# Patient Record
Sex: Female | Born: 1935 | Race: White | Hispanic: No | Smoking: Never smoker
Health system: Southern US, Community
[De-identification: ages and names within clinical notes are randomized; demographics above are authoritative.]

## PROBLEM LIST (undated history)

## (undated) DIAGNOSIS — N393 Stress incontinence (female) (male): Secondary | ICD-10-CM

## (undated) DIAGNOSIS — Z950 Presence of cardiac pacemaker: Secondary | ICD-10-CM

## (undated) DIAGNOSIS — N289 Disorder of kidney and ureter, unspecified: Secondary | ICD-10-CM

## (undated) DIAGNOSIS — R0989 Other specified symptoms and signs involving the circulatory and respiratory systems: Secondary | ICD-10-CM

## (undated) DIAGNOSIS — F0150 Vascular dementia without behavioral disturbance: Secondary | ICD-10-CM

## (undated) DIAGNOSIS — K219 Gastro-esophageal reflux disease without esophagitis: Secondary | ICD-10-CM

## (undated) DIAGNOSIS — L409 Psoriasis, unspecified: Secondary | ICD-10-CM

## (undated) DIAGNOSIS — I10 Essential (primary) hypertension: Secondary | ICD-10-CM

## (undated) DIAGNOSIS — I495 Sick sinus syndrome: Secondary | ICD-10-CM

## (undated) DIAGNOSIS — T7840XA Allergy, unspecified, initial encounter: Secondary | ICD-10-CM

## (undated) DIAGNOSIS — R42 Dizziness and giddiness: Secondary | ICD-10-CM

## (undated) DIAGNOSIS — I480 Paroxysmal atrial fibrillation: Secondary | ICD-10-CM

## (undated) DIAGNOSIS — Z9229 Personal history of other drug therapy: Secondary | ICD-10-CM

## (undated) DIAGNOSIS — I872 Venous insufficiency (chronic) (peripheral): Secondary | ICD-10-CM

## (undated) HISTORY — DX: Gastro-esophageal reflux disease without esophagitis: K21.9

## (undated) HISTORY — DX: Disorder of kidney and ureter, unspecified: N28.9

## (undated) HISTORY — DX: Presence of cardiac pacemaker: Z95.0

## (undated) HISTORY — DX: Allergy, unspecified, initial encounter: T78.40XA

## (undated) HISTORY — DX: Psoriasis, unspecified: L40.9

## (undated) HISTORY — DX: Dizziness and giddiness: R42

## (undated) HISTORY — PX: ESOPHAGEAL DILATION: SHX303

## (undated) HISTORY — PX: ABDOMINAL HYSTERECTOMY: SHX81

## (undated) HISTORY — DX: Stress incontinence (female) (male): N39.3

## (undated) HISTORY — PX: CHOLECYSTECTOMY: SHX55

## (undated) HISTORY — PX: OTHER SURGICAL HISTORY: SHX169

## (undated) HISTORY — DX: Essential (primary) hypertension: I10

## (undated) HISTORY — DX: Other specified symptoms and signs involving the circulatory and respiratory systems: R09.89

## (undated) HISTORY — DX: Sick sinus syndrome: I49.5

## (undated) HISTORY — DX: Personal history of other drug therapy: Z92.29

## (undated) HISTORY — DX: Venous insufficiency (chronic) (peripheral): I87.2

## (undated) HISTORY — DX: Paroxysmal atrial fibrillation: I48.0

## (undated) HISTORY — DX: Vascular dementia, unspecified severity, without behavioral disturbance, psychotic disturbance, mood disturbance, and anxiety: F01.50

---

## 1999-03-10 ENCOUNTER — Encounter: Admission: RE | Admit: 1999-03-10 | Discharge: 1999-03-10 | Payer: Self-pay | Admitting: General Surgery

## 1999-03-10 ENCOUNTER — Encounter: Payer: Self-pay | Admitting: General Surgery

## 2000-03-10 ENCOUNTER — Encounter: Admission: RE | Admit: 2000-03-10 | Discharge: 2000-03-10 | Payer: Self-pay | Admitting: General Surgery

## 2000-03-10 ENCOUNTER — Encounter: Payer: Self-pay | Admitting: General Surgery

## 2001-03-16 ENCOUNTER — Encounter: Admission: RE | Admit: 2001-03-16 | Discharge: 2001-03-16 | Payer: Self-pay | Admitting: General Surgery

## 2001-03-16 ENCOUNTER — Encounter: Payer: Self-pay | Admitting: General Surgery

## 2001-04-11 ENCOUNTER — Ambulatory Visit (HOSPITAL_COMMUNITY): Admission: RE | Admit: 2001-04-11 | Discharge: 2001-04-11 | Payer: Self-pay | Admitting: Cardiology

## 2001-07-01 ENCOUNTER — Ambulatory Visit (HOSPITAL_COMMUNITY): Admission: RE | Admit: 2001-07-01 | Discharge: 2001-07-01 | Payer: Self-pay | Admitting: Cardiology

## 2001-11-16 ENCOUNTER — Ambulatory Visit (HOSPITAL_COMMUNITY): Admission: RE | Admit: 2001-11-16 | Discharge: 2001-11-16 | Payer: Self-pay | Admitting: Cardiology

## 2002-02-02 DIAGNOSIS — I495 Sick sinus syndrome: Secondary | ICD-10-CM

## 2002-02-02 HISTORY — DX: Sick sinus syndrome: I49.5

## 2002-02-07 ENCOUNTER — Inpatient Hospital Stay (HOSPITAL_COMMUNITY): Admission: EM | Admit: 2002-02-07 | Discharge: 2002-02-15 | Payer: Self-pay | Admitting: Internal Medicine

## 2002-02-07 ENCOUNTER — Encounter: Payer: Self-pay | Admitting: Internal Medicine

## 2002-02-08 ENCOUNTER — Encounter (INDEPENDENT_AMBULATORY_CARE_PROVIDER_SITE_OTHER): Payer: Self-pay | Admitting: Cardiology

## 2002-02-14 ENCOUNTER — Encounter: Payer: Self-pay | Admitting: Internal Medicine

## 2002-02-15 ENCOUNTER — Encounter: Payer: Self-pay | Admitting: Cardiology

## 2002-03-17 ENCOUNTER — Encounter: Payer: Self-pay | Admitting: General Surgery

## 2002-03-17 ENCOUNTER — Encounter: Admission: RE | Admit: 2002-03-17 | Discharge: 2002-03-17 | Payer: Self-pay | Admitting: General Surgery

## 2002-11-03 ENCOUNTER — Encounter: Payer: Self-pay | Admitting: Internal Medicine

## 2002-11-03 ENCOUNTER — Other Ambulatory Visit: Admission: RE | Admit: 2002-11-03 | Discharge: 2002-11-03 | Payer: Self-pay | Admitting: Internal Medicine

## 2002-11-03 LAB — CONVERTED CEMR LAB

## 2003-03-28 ENCOUNTER — Encounter: Admission: RE | Admit: 2003-03-28 | Discharge: 2003-03-28 | Payer: Self-pay | Admitting: General Surgery

## 2003-06-12 ENCOUNTER — Encounter: Payer: Self-pay | Admitting: Internal Medicine

## 2004-04-03 ENCOUNTER — Ambulatory Visit: Payer: Self-pay | Admitting: Internal Medicine

## 2004-04-04 ENCOUNTER — Encounter: Admission: RE | Admit: 2004-04-04 | Discharge: 2004-04-04 | Payer: Self-pay | Admitting: General Surgery

## 2004-07-11 ENCOUNTER — Ambulatory Visit: Payer: Self-pay | Admitting: Internal Medicine

## 2004-10-02 ENCOUNTER — Ambulatory Visit: Payer: Self-pay | Admitting: Internal Medicine

## 2004-11-14 ENCOUNTER — Ambulatory Visit: Payer: Self-pay | Admitting: Internal Medicine

## 2005-01-06 ENCOUNTER — Ambulatory Visit: Payer: Self-pay | Admitting: Internal Medicine

## 2005-04-15 ENCOUNTER — Encounter: Admission: RE | Admit: 2005-04-15 | Discharge: 2005-04-15 | Payer: Self-pay | Admitting: Internal Medicine

## 2005-05-07 ENCOUNTER — Ambulatory Visit: Payer: Self-pay | Admitting: Internal Medicine

## 2005-09-08 ENCOUNTER — Ambulatory Visit: Payer: Self-pay | Admitting: Internal Medicine

## 2006-01-07 ENCOUNTER — Ambulatory Visit: Payer: Self-pay | Admitting: Internal Medicine

## 2006-02-11 ENCOUNTER — Ambulatory Visit: Payer: Self-pay | Admitting: Internal Medicine

## 2006-04-20 ENCOUNTER — Encounter: Admission: RE | Admit: 2006-04-20 | Discharge: 2006-04-20 | Payer: Self-pay | Admitting: General Surgery

## 2006-06-10 ENCOUNTER — Encounter: Payer: Self-pay | Admitting: Internal Medicine

## 2006-06-10 DIAGNOSIS — I498 Other specified cardiac arrhythmias: Secondary | ICD-10-CM | POA: Insufficient documentation

## 2006-06-10 DIAGNOSIS — M949 Disorder of cartilage, unspecified: Secondary | ICD-10-CM

## 2006-06-10 DIAGNOSIS — M899 Disorder of bone, unspecified: Secondary | ICD-10-CM | POA: Insufficient documentation

## 2006-06-10 DIAGNOSIS — L408 Other psoriasis: Secondary | ICD-10-CM

## 2006-06-10 DIAGNOSIS — N183 Chronic kidney disease, stage 3 (moderate): Secondary | ICD-10-CM

## 2006-06-10 DIAGNOSIS — I10 Essential (primary) hypertension: Secondary | ICD-10-CM | POA: Insufficient documentation

## 2006-06-10 DIAGNOSIS — J309 Allergic rhinitis, unspecified: Secondary | ICD-10-CM | POA: Insufficient documentation

## 2006-06-10 DIAGNOSIS — I480 Paroxysmal atrial fibrillation: Secondary | ICD-10-CM | POA: Insufficient documentation

## 2006-06-11 DIAGNOSIS — M19049 Primary osteoarthritis, unspecified hand: Secondary | ICD-10-CM | POA: Insufficient documentation

## 2006-06-18 ENCOUNTER — Ambulatory Visit: Payer: Self-pay | Admitting: Internal Medicine

## 2006-06-21 LAB — CONVERTED CEMR LAB
ALT: 18 units/L (ref 0–40)
AST: 29 units/L (ref 0–37)
Alkaline Phosphatase: 35 units/L — ABNORMAL LOW (ref 39–117)
BUN: 24 mg/dL — ABNORMAL HIGH (ref 6–23)
Chloride: 106 meq/L (ref 96–112)
Creatinine, Ser: 1.3 mg/dL — ABNORMAL HIGH (ref 0.4–1.2)
Eosinophils Relative: 1.7 % (ref 0.0–5.0)
GFR calc Af Amer: 51 mL/min
Glucose, Bld: 101 mg/dL — ABNORMAL HIGH (ref 70–99)
HCT: 36.3 % (ref 36.0–46.0)
Hemoglobin: 12.4 g/dL (ref 12.0–15.0)
Lymphocytes Relative: 43.2 % (ref 12.0–46.0)
MCHC: 34.1 g/dL (ref 30.0–36.0)
Phosphorus: 3.7 mg/dL (ref 2.3–4.6)
Potassium: 4.2 meq/L (ref 3.5–5.1)
RDW: 12.5 % (ref 11.5–14.6)
Sed Rate: 23 mm/hr (ref 0–25)
Sodium: 143 meq/L (ref 135–145)
Total Protein: 6 g/dL (ref 6.0–8.3)
WBC: 7 10*3/uL (ref 4.5–10.5)

## 2006-07-20 ENCOUNTER — Telehealth: Payer: Self-pay | Admitting: Family Medicine

## 2006-07-29 ENCOUNTER — Ambulatory Visit: Payer: Self-pay | Admitting: Internal Medicine

## 2006-11-17 ENCOUNTER — Ambulatory Visit: Payer: Self-pay | Admitting: Internal Medicine

## 2006-11-19 LAB — CONVERTED CEMR LAB
Free T4: 1.1 ng/dL (ref 0.6–1.6)
GFR calc Af Amer: 46 mL/min
Glucose, Bld: 90 mg/dL (ref 70–99)
Sodium: 140 meq/L (ref 135–145)

## 2007-01-13 ENCOUNTER — Ambulatory Visit: Payer: Self-pay | Admitting: Internal Medicine

## 2007-01-17 ENCOUNTER — Ambulatory Visit: Payer: Self-pay | Admitting: Internal Medicine

## 2007-04-01 ENCOUNTER — Other Ambulatory Visit: Payer: Self-pay

## 2007-04-01 ENCOUNTER — Encounter: Payer: Self-pay | Admitting: Internal Medicine

## 2007-04-02 ENCOUNTER — Encounter: Payer: Self-pay | Admitting: Internal Medicine

## 2007-04-04 ENCOUNTER — Encounter: Payer: Self-pay | Admitting: Internal Medicine

## 2007-04-05 ENCOUNTER — Telehealth (INDEPENDENT_AMBULATORY_CARE_PROVIDER_SITE_OTHER): Payer: Self-pay | Admitting: *Deleted

## 2007-04-06 ENCOUNTER — Ambulatory Visit: Payer: Self-pay | Admitting: Internal Medicine

## 2007-04-06 DIAGNOSIS — R42 Dizziness and giddiness: Secondary | ICD-10-CM

## 2007-04-06 DIAGNOSIS — R7301 Impaired fasting glucose: Secondary | ICD-10-CM

## 2007-04-12 ENCOUNTER — Encounter: Payer: Self-pay | Admitting: Internal Medicine

## 2007-04-13 ENCOUNTER — Telehealth: Payer: Self-pay | Admitting: Internal Medicine

## 2007-04-18 ENCOUNTER — Encounter: Payer: Self-pay | Admitting: Internal Medicine

## 2007-04-20 ENCOUNTER — Encounter: Payer: Self-pay | Admitting: Internal Medicine

## 2007-04-28 ENCOUNTER — Telehealth (INDEPENDENT_AMBULATORY_CARE_PROVIDER_SITE_OTHER): Payer: Self-pay | Admitting: *Deleted

## 2007-04-29 ENCOUNTER — Ambulatory Visit: Payer: Self-pay | Admitting: Internal Medicine

## 2007-05-02 ENCOUNTER — Encounter: Admission: RE | Admit: 2007-05-02 | Discharge: 2007-05-02 | Payer: Self-pay | Admitting: Internal Medicine

## 2007-05-04 ENCOUNTER — Encounter (INDEPENDENT_AMBULATORY_CARE_PROVIDER_SITE_OTHER): Payer: Self-pay | Admitting: *Deleted

## 2007-05-10 ENCOUNTER — Encounter: Payer: Self-pay | Admitting: Internal Medicine

## 2007-05-11 ENCOUNTER — Encounter: Payer: Self-pay | Admitting: Internal Medicine

## 2007-05-19 ENCOUNTER — Telehealth: Payer: Self-pay | Admitting: Internal Medicine

## 2007-05-26 ENCOUNTER — Telehealth (INDEPENDENT_AMBULATORY_CARE_PROVIDER_SITE_OTHER): Payer: Self-pay | Admitting: *Deleted

## 2007-08-04 ENCOUNTER — Telehealth: Payer: Self-pay | Admitting: Family Medicine

## 2007-09-01 ENCOUNTER — Ambulatory Visit: Payer: Self-pay | Admitting: Internal Medicine

## 2007-09-05 LAB — CONVERTED CEMR LAB
AST: 26 units/L (ref 0–37)
BUN: 28 mg/dL — ABNORMAL HIGH (ref 6–23)
Basophils Absolute: 0.1 10*3/uL (ref 0.0–0.1)
CO2: 27 meq/L (ref 19–32)
Calcium: 8.8 mg/dL (ref 8.4–10.5)
Chloride: 108 meq/L (ref 96–112)
Creatinine, Ser: 1.4 mg/dL — ABNORMAL HIGH (ref 0.4–1.2)
Eosinophils Absolute: 0.1 10*3/uL (ref 0.0–0.7)
Eosinophils Relative: 0.9 % (ref 0.0–5.0)
Hemoglobin: 11.2 g/dL — ABNORMAL LOW (ref 12.0–15.0)
Lipase: 41 units/L (ref 11.0–59.0)
Lymphocytes Relative: 20.8 % (ref 12.0–46.0)
MCHC: 33.7 g/dL (ref 30.0–36.0)
MCV: 92.5 fL (ref 78.0–100.0)
Phosphorus: 3.7 mg/dL (ref 2.3–4.6)
Platelets: 241 10*3/uL (ref 150–400)
RBC: 3.6 M/uL — ABNORMAL LOW (ref 3.87–5.11)
Total Bilirubin: 0.4 mg/dL (ref 0.3–1.2)
Total Protein: 6.4 g/dL (ref 6.0–8.3)
WBC: 10.6 10*3/uL — ABNORMAL HIGH (ref 4.5–10.5)

## 2007-10-26 ENCOUNTER — Ambulatory Visit: Payer: Self-pay | Admitting: Internal Medicine

## 2007-11-24 ENCOUNTER — Telehealth: Payer: Self-pay | Admitting: Internal Medicine

## 2007-12-05 ENCOUNTER — Ambulatory Visit: Payer: Self-pay | Admitting: Family Medicine

## 2007-12-08 ENCOUNTER — Encounter: Payer: Self-pay | Admitting: Family Medicine

## 2008-01-02 ENCOUNTER — Ambulatory Visit: Payer: Self-pay | Admitting: Internal Medicine

## 2008-02-09 ENCOUNTER — Ambulatory Visit: Payer: Self-pay | Admitting: Family Medicine

## 2008-02-13 ENCOUNTER — Encounter: Payer: Self-pay | Admitting: Internal Medicine

## 2008-02-15 ENCOUNTER — Telehealth: Payer: Self-pay | Admitting: Internal Medicine

## 2008-02-24 ENCOUNTER — Ambulatory Visit: Payer: Self-pay | Admitting: Internal Medicine

## 2008-03-01 ENCOUNTER — Encounter: Payer: Self-pay | Admitting: Internal Medicine

## 2008-04-11 ENCOUNTER — Encounter: Payer: Self-pay | Admitting: Internal Medicine

## 2008-04-19 ENCOUNTER — Telehealth: Payer: Self-pay | Admitting: Internal Medicine

## 2008-05-02 ENCOUNTER — Encounter: Admission: RE | Admit: 2008-05-02 | Discharge: 2008-05-02 | Payer: Self-pay | Admitting: Internal Medicine

## 2008-05-03 ENCOUNTER — Encounter: Payer: Self-pay | Admitting: Internal Medicine

## 2008-05-07 ENCOUNTER — Ambulatory Visit: Payer: Self-pay | Admitting: Internal Medicine

## 2008-05-09 LAB — CONVERTED CEMR LAB
ALT: 13 units/L (ref 0–35)
AST: 24 units/L (ref 0–37)
Bilirubin, Direct: 0.1 mg/dL (ref 0.0–0.3)
CO2: 27 meq/L (ref 19–32)
Calcium: 9 mg/dL (ref 8.4–10.5)
Eosinophils Absolute: 0.2 10*3/uL (ref 0.0–0.7)
GFR calc non Af Amer: 26.86 mL/min (ref >60)
HCT: 33.1 % — ABNORMAL LOW (ref 36.0–46.0)
Hemoglobin: 11.2 g/dL — ABNORMAL LOW (ref 12.0–15.0)
Lymphocytes Relative: 27.6 % (ref 12.0–46.0)
Lymphs Abs: 2.4 10*3/uL (ref 0.7–4.0)
Monocytes Relative: 5.2 % (ref 3.0–12.0)
Neutrophils Relative %: 64.8 % (ref 43.0–77.0)
Potassium: 4.3 meq/L (ref 3.5–5.1)
RDW: 13.5 % (ref 11.5–14.6)
TSH: 2.52 microintl units/mL (ref 0.35–5.50)
Total Bilirubin: 0.6 mg/dL (ref 0.3–1.2)
Total Protein: 6 g/dL (ref 6.0–8.3)

## 2008-05-11 ENCOUNTER — Encounter: Payer: Self-pay | Admitting: Internal Medicine

## 2008-09-06 ENCOUNTER — Ambulatory Visit: Payer: Self-pay | Admitting: Internal Medicine

## 2008-09-07 LAB — CONVERTED CEMR LAB
Albumin: 3.7 g/dL (ref 3.5–5.2)
BUN: 35 mg/dL — ABNORMAL HIGH (ref 6–23)
Basophils Absolute: 0 10*3/uL (ref 0.0–0.1)
Basophils Relative: 0.3 % (ref 0.0–3.0)
Chloride: 112 meq/L (ref 96–112)
Eosinophils Relative: 2.5 % (ref 0.0–5.0)
Glucose, Bld: 97 mg/dL (ref 70–99)
Lymphocytes Relative: 38.1 % (ref 12.0–46.0)
Lymphs Abs: 3.1 10*3/uL (ref 0.7–4.0)
MCV: 93.4 fL (ref 78.0–100.0)
Monocytes Relative: 6.4 % (ref 3.0–12.0)
Neutro Abs: 4.4 10*3/uL (ref 1.4–7.7)
Neutrophils Relative %: 52.7 % (ref 43.0–77.0)
Sodium: 145 meq/L (ref 135–145)

## 2008-10-15 ENCOUNTER — Telehealth: Payer: Self-pay | Admitting: Internal Medicine

## 2008-12-25 ENCOUNTER — Ambulatory Visit: Payer: Self-pay | Admitting: Internal Medicine

## 2009-01-10 ENCOUNTER — Ambulatory Visit: Payer: Self-pay | Admitting: Internal Medicine

## 2009-02-05 ENCOUNTER — Ambulatory Visit: Payer: Self-pay | Admitting: Family Medicine

## 2009-05-10 ENCOUNTER — Encounter: Admission: RE | Admit: 2009-05-10 | Discharge: 2009-05-10 | Payer: Self-pay | Admitting: Internal Medicine

## 2009-05-14 ENCOUNTER — Encounter: Payer: Self-pay | Admitting: Internal Medicine

## 2009-07-08 ENCOUNTER — Ambulatory Visit: Payer: Self-pay | Admitting: Internal Medicine

## 2009-07-16 ENCOUNTER — Telehealth: Payer: Self-pay | Admitting: Internal Medicine

## 2009-07-18 ENCOUNTER — Encounter: Payer: Self-pay | Admitting: Internal Medicine

## 2009-07-19 ENCOUNTER — Telehealth (INDEPENDENT_AMBULATORY_CARE_PROVIDER_SITE_OTHER): Payer: Self-pay | Admitting: *Deleted

## 2009-07-30 ENCOUNTER — Telehealth: Payer: Self-pay | Admitting: Internal Medicine

## 2009-07-31 ENCOUNTER — Encounter: Payer: Self-pay | Admitting: Internal Medicine

## 2009-08-02 ENCOUNTER — Encounter: Payer: Self-pay | Admitting: Internal Medicine

## 2009-08-20 ENCOUNTER — Encounter: Payer: Self-pay | Admitting: Internal Medicine

## 2009-08-23 ENCOUNTER — Ambulatory Visit: Payer: Self-pay | Admitting: Internal Medicine

## 2009-08-23 DIAGNOSIS — M5137 Other intervertebral disc degeneration, lumbosacral region: Secondary | ICD-10-CM

## 2009-08-26 ENCOUNTER — Encounter: Payer: Self-pay | Admitting: Internal Medicine

## 2009-08-30 ENCOUNTER — Telehealth: Payer: Self-pay | Admitting: Internal Medicine

## 2009-09-06 ENCOUNTER — Telehealth: Payer: Self-pay | Admitting: Internal Medicine

## 2009-09-09 ENCOUNTER — Ambulatory Visit: Payer: Self-pay | Admitting: Internal Medicine

## 2009-09-12 ENCOUNTER — Encounter: Payer: Self-pay | Admitting: Internal Medicine

## 2009-09-16 ENCOUNTER — Telehealth: Payer: Self-pay | Admitting: Internal Medicine

## 2009-09-20 ENCOUNTER — Telehealth: Payer: Self-pay | Admitting: Internal Medicine

## 2009-09-24 ENCOUNTER — Encounter: Payer: Self-pay | Admitting: Internal Medicine

## 2009-09-24 ENCOUNTER — Telehealth: Payer: Self-pay | Admitting: Internal Medicine

## 2009-09-26 ENCOUNTER — Telehealth: Payer: Self-pay | Admitting: Internal Medicine

## 2009-09-26 ENCOUNTER — Encounter: Payer: Self-pay | Admitting: Internal Medicine

## 2009-10-01 ENCOUNTER — Ambulatory Visit: Payer: Self-pay | Admitting: Internal Medicine

## 2009-10-03 ENCOUNTER — Encounter: Payer: Self-pay | Admitting: Internal Medicine

## 2009-10-11 ENCOUNTER — Encounter: Payer: Self-pay | Admitting: Internal Medicine

## 2009-10-16 ENCOUNTER — Telehealth: Payer: Self-pay | Admitting: Internal Medicine

## 2009-10-21 ENCOUNTER — Telehealth: Payer: Self-pay | Admitting: Internal Medicine

## 2009-10-22 ENCOUNTER — Encounter: Payer: Self-pay | Admitting: Internal Medicine

## 2009-11-19 ENCOUNTER — Encounter: Payer: Self-pay | Admitting: Internal Medicine

## 2009-12-19 ENCOUNTER — Encounter: Payer: Self-pay | Admitting: Internal Medicine

## 2009-12-19 ENCOUNTER — Telehealth (INDEPENDENT_AMBULATORY_CARE_PROVIDER_SITE_OTHER): Payer: Self-pay | Admitting: *Deleted

## 2009-12-30 ENCOUNTER — Encounter: Payer: Self-pay | Admitting: Internal Medicine

## 2010-01-31 ENCOUNTER — Ambulatory Visit: Payer: Self-pay | Admitting: Internal Medicine

## 2010-02-04 LAB — CONVERTED CEMR LAB
Basophils Absolute: 0 10*3/uL (ref 0.0–0.1)
Chloride: 109 meq/L (ref 96–112)
Creatinine, Ser: 1.9 mg/dL — ABNORMAL HIGH (ref 0.4–1.2)
Free T4: 1.03 ng/dL (ref 0.60–1.60)
Lymphs Abs: 2.1 10*3/uL (ref 0.7–4.0)
MCV: 95.9 fL (ref 78.0–100.0)
Monocytes Absolute: 0.6 10*3/uL (ref 0.1–1.0)
Neutro Abs: 4 10*3/uL (ref 1.4–7.7)
Phosphorus: 3 mg/dL (ref 2.3–4.6)
Platelets: 225 10*3/uL (ref 150.0–400.0)
RDW: 15.5 % — ABNORMAL HIGH (ref 11.5–14.6)
WBC: 6.9 10*3/uL (ref 4.5–10.5)

## 2010-02-19 ENCOUNTER — Encounter: Payer: Self-pay | Admitting: Cardiovascular Disease

## 2010-02-19 ENCOUNTER — Ambulatory Visit
Admission: RE | Admit: 2010-02-19 | Discharge: 2010-02-19 | Payer: Self-pay | Source: Home / Self Care | Attending: Cardiovascular Disease | Admitting: Cardiovascular Disease

## 2010-03-02 LAB — CONVERTED CEMR LAB
CO2: 31 meq/L (ref 19–32)
Chloride: 107 meq/L (ref 96–112)
Creatinine, Ser: 1.2 mg/dL (ref 0.4–1.2)
GFR calc Af Amer: 51 mL/min
GFR calc non Af Amer: 42 mL/min
Sodium: 143 meq/L (ref 135–145)

## 2010-03-04 NOTE — Miscellaneous (Signed)
Summary: Missed Visit/[facility]  Missed Visit/[facility]   Imported By: Lanelle Bal 10/03/2009 11:53:56  _____________________________________________________________________  External Attachment:    Type:   Image     Comment:   External Document

## 2010-03-04 NOTE — Progress Notes (Signed)
Summary: Surgical Clearance  Phone Note Call from Patient   Caller: [facility] Call For: Cindee Salt MD Summary of Call: [facility] faxed form asking for clearance to do a lumbar epidural steroid injection, does pt need appt to do this? ? pt canceled 10/17/2009 appt. Please advise, Form on your desk  Initial call taken by: Mervin Hack CMA Duncan Dull),  December 19, 2009 4:58 PM  Follow-up for Phone Call        I signed form as she should be okay for this can keep December appt  I stated they will need to let me know if visit from August is not recent enough Follow-up by: Cindee Salt MD,  December 19, 2009 5:26 PM  Additional Follow-up for Phone Call Additional follow up Details #1::        Form faxed to [facility]. Additional Follow-up by: Beau Fanny,  December 20, 2009 8:53 AM

## 2010-03-04 NOTE — Consult Note (Signed)
Summary: [facility]   Imported By: Lester Grainger 07/29/2009 07:25:02  _____________________________________________________________________  External Attachment:    Type:   Image     Comment:   External Document  Appended Document: [facility] form signed--then faxed back

## 2010-03-04 NOTE — Letter (Signed)
Summary: [facility]  [facility]   Imported By: Maryln Gottron 12/27/2009 12:31:44  _____________________________________________________________________  External Attachment:    Type:   Image     Comment:   External Document  Appended Document: [facility] Evaluation considering lumbar epidural steroid injection

## 2010-03-04 NOTE — Letter (Signed)
Summary: Results Follow up Letter  [place] [facility]  [address] [place], [place] [address]   Phone: [phone]  Fax: [phone]    05/14/2009 MRN: [id number]  [facility] [address] [place], [place]  [address]  Dear Ms. Carrier,  The following are the results of your recent test(s):  Test         Result    Pap Smear:        Normal _____  Not Normal _____ Comments: ______________________________________________________ Cholesterol: LDL(Bad cholesterol):         Your goal is less than:         HDL (Good cholesterol):       Your goal is more than: Comments:  ______________________________________________________ Mammogram:        Normal __X___  Not Normal _____ Comments: Mammogram looks fine At your age, I don't recommend continued screening with mammograms. ___________________________________________________________________ Hemoccult:        Normal _____  Not normal _______ Comments:    _____________________________________________________________________ Other Tests:    We routinely do not discuss normal results over the telephone.  If you desire a copy of the results, or you have any questions about this information we can discuss them at your next office visit.   Sincerely,      Tillman Abide, MD

## 2010-03-04 NOTE — Miscellaneous (Signed)
Summary: PT Care Plan/[facility]  PT Care Plan/[facility]   Imported By: Lanelle Bal 08/06/2009 11:11:06  _____________________________________________________________________  External Attachment:    Type:   Image     Comment:   External Document

## 2010-03-04 NOTE — Letter (Signed)
Summary: [facility]   Imported By: Lanelle Bal 08/09/2009 11:08:36  _____________________________________________________________________  External Attachment:    Type:   Image     Comment:   External Document

## 2010-03-04 NOTE — Progress Notes (Signed)
Summary: Complete form  Phone Note Call from Patient Call back at [phone]   Caller: Son/Dale Call For: Cindee Salt MD Summary of Call: Patient's son called to let you know that he will be faxing a Long Term Care Claim over to you today.  Please let him know if you can complete this without the patient coming and the billing for this. Initial call taken by: Sydell Axon LPN,  September 24, 2009 11:29 AM  Follow-up for Phone Call        Please call patient If she is at [facility] now, I can do the form ---but it won't do any good if she has gone back home  If she is at [facility], I can see her when I go there next Thursday and fill out the form then. I will need to see her to be able to correctly fill out the form Cindee Salt MD  September 25, 2009 7:38 AM   spoke with son Amada Jupiter, pt is in [address] on the independent side at [facility], can you still do the form there? Please advise. DeShannon Smith CMA Duncan Dull)  September 25, 2009 9:41 AM   If she is independent living, I am going to need an appt to reassess her care needs so I can do the form Cindee Salt MD  September 25, 2009 9:45 AM   tried calling son again, his voicemail box is not set up yet, could not leave message, will try again later. DeShannon Smith CMA Duncan Dull)  September 25, 2009 10:49 AM   spoke with son, he will have the pt to call and schedule the appt. DeShannon Smith CMA Duncan Dull)  September 26, 2009 10:20 AM   okay will hold the form until her visit Follow-up by: Cindee Salt MD,  September 26, 2009 12:53 PM

## 2010-03-04 NOTE — Miscellaneous (Signed)
Summary: Face to Face Encounter Form/[facility]  Face to Face Encounter Form/[facility]   Imported By: Lanelle Bal 09/19/2009 08:40:03  _____________________________________________________________________  External Attachment:    Type:   Image     Comment:   External Document

## 2010-03-04 NOTE — Progress Notes (Signed)
Summary: Pain no better  Phone Note Call from Patient Call back at Home Phone [phone]   Caller: Patient Call For: Cindee Salt MD Summary of Call: Patient says she is using the Tramadol, one tablet by mouth every 3 hours and it is not even touching the pain.  She is requesting a stronger pain medication to be called to [facility] and ask them to deliver to her home please. Initial call taken by: Delilah Shan CMA Duncan Dull),  July 19, 2009 9:37 AM  Follow-up for Phone Call        okay to try hydrocodone APAP  5/325 1-2 tabs every 4 hours as needed  #60 x 0 can use in addtion to tramadol if needed but watch for sedation or mental cloudiness Follow-up by: Cindee Salt MD,  July 19, 2009 10:16 AM  Additional Follow-up for Phone Call Additional follow up Details #1::        Patient Advised. Medication phoned to pharmacy.  Additional Follow-up by: Delilah Shan CMA (AAMA),  July 19, 2009 11:09 AM    New/Updated Medications: HYDROCODONE-ACETAMINOPHEN 5-325 MG TABS (HYDROCODONE-ACETAMINOPHEN) One or two tablets every 4 hours as needed. Prescriptions: HYDROCODONE-ACETAMINOPHEN 5-325 MG TABS (HYDROCODONE-ACETAMINOPHEN) One or two tablets every 4 hours as needed.  #60 x 0   Entered by:   Delilah Shan CMA (AAMA)   Authorized by:   Cindee Salt MD   Signed by:   Delilah Shan CMA (AAMA) on 07/19/2009   Method used:   Handwritten   RxID:   [id number]

## 2010-03-04 NOTE — Assessment & Plan Note (Signed)
Summary: 3:00 cough/rbh   Vital Signs:  Patient profile:   77 year old female Weight:      147 pounds BMI:     26.98 Temp:     97.5 degrees F oral Pulse rate:   76 / minute Pulse rhythm:   regular BP sitting:   124 / 70  (left arm) Cuff size:   regular  Vitals Entered By: Linde Gillis CMA Duncan Dull) (February 05, 2009 3:22 PM) CC: cough   History of Present Illness: Carla Mendez is a 77 y/o female who presents today with a 2 day history of cough with hoarsenes, headache, and some pressure over the sinuses.  She notes that at first the cough seemed to be coming from her chest.  She denied any fever or production of sputum.  She denies fever or chills, ear pain, rhinitis or chest pain, SOB or N/V. Additionally, she states she has had a harder time hearing for the last few months. She relates problem with wax at times as well.  Problems Prior to Update: 1)  Dehydration  (ICD-276.51) 2)  Lateral Epicondylitis, Right  (ICD-726.32) 3)  Impaired Fasting Glucose  (ICD-790.21) 4)  Vertigo  (ICD-780.4) 5)  Osteoarthritis  (ICD-715.90) 6)  Renal Insufficiency  (ICD-588.9) 7)  Bradycardia  (ICD-427.89) 8)  Psoriasis  (ICD-696.1) 9)  Osteopenia  (ICD-733.90) 10)  Hypertension  (ICD-401.9) 11)  Gerd (PAST STRICTURE)  (ICD-530.81) 12)  Atrial Fibrillation  (ICD-427.31) 13)  Allergic Rhinitis  (ICD-477.9)  Medications Prior to Update: 1)  Lisinopril-Hydrochlorothiazide 20-12.5 Mg Tabs (Lisinopril-Hydrochlorothiazide) .... Take 1 Tablet By Mouth Once A Day 2)  Amiodarone Hcl 200 Mg Tabs (Amiodarone Hcl) .... Take 1/2 By Mouth Daily 3)  Protonix 40 Mg Tbec (Pantoprazole Sodium) .... Take One By Mouth Daily 4)  Meloxicam 7.5 Mg  Tabs (Meloxicam) .Marland Kitchen.. 1 Daily As Needed For Arthritis Pain 5)  Triamcinolone Acetonide 0.1 % Crea (Triamcinolone Acetonide) .... Apply To Affected Areas Two Times A Day Until Clear 6)  Motion Sickness Relief Ii 25 Mg Tabs (Meclizine Hcl) .... Take 1 Tablet Three Times A  Day 7)  Aspirin Ec 81 Mg Tbec (Aspirin) .Marland Kitchen.. 1 Tab Every Other Day  Allergies: 1)  Motrin (Ibuprofen)  Physical Exam  General:  Well-developed,well-nourished,in no acute distress; alert,appropriate and cooperative throughout examination Head:  Normocephalic and atraumatic without obvious abnormalities. No apparent alopecia or balding. Eyes:  Conjunctiva clear bilat. Ears:  External ear exam shows no significant lesions or deformities.  Otoscopic examination reveals tympanic membranes are intact bilaterally without bulging, retraction, inflammation or discharge. Hearing is grossly normal bilaterally.  Some waxy build-up present in ear canals, R>L. L cleared, R partially cleared , both via currette. Nose:  External nasal examination shows no deformity or inflammation. Nasal mucosa are pink and moist without lesions or exudates. Mouth:  Oral mucosa and oropharynx without lesions or exudates.  Teeth in good repair. Neck:  No deformities, masses, or tenderness noted. Lungs:  Normal respiratory effort, chest expands symmetrically. Lungs are clear to auscultation, no crackles or wheezes. Heart:  Normal rate and regular rhythm. S1 and S2 normal without gallop, murmur, click, rub or other extra sounds.   Impression & Recommendations:  Problem # 1:  URI (ICD-465.9) Assessment New See instructions The following medications were removed from the medication list:    Meloxicam 7.5 Mg Tabs (Meloxicam) .Marland Kitchen... 1 daily as needed for arthritis pain    Aspirin Ec 81 Mg Tbec (Aspirin) .Marland Kitchen... 1 tab every other day  Problem # 2:  CERUMEN IMPACTION, RIGHT MILD CERUMEN LEFT (ICD-380.4) Assessment: New Removed via currette on left partially on right and then discussed home irrigation.  Complete Medication List: 1)  Lisinopril-hydrochlorothiazide 20-12.5 Mg Tabs (Lisinopril-hydrochlorothiazide) .... Take 1 tablet by mouth once a day 2)  Amiodarone Hcl 200 Mg Tabs (Amiodarone hcl) .... Take 1/2 by mouth  daily 3)  Protonix 40 Mg Tbec (Pantoprazole sodium) .... Take one by mouth daily 4)  Triamcinolone Acetonide 0.1 % Crea (Triamcinolone acetonide) .... Apply to affected areas two times a day until clear 5)  Motion Sickness Relief Ii 25 Mg Tabs (Meclizine hcl) .... Take 1 tablet three times a day  Patient Instructions: 1)  Take Guaifenesin by going to [facility] or [facility] and getting MUCOUS RELIEF EXPECTORANT (400mg ), take 11/2 tabs by mouth AM and NOON. 2)  Drink lots of fluids anytime taking Guaifenesin.  3)  Tyl ES 2 three times a day as needed. 4)  Keep lozenge in mouth for a few days. 5)  Tessalon her husband has as needed for cough. 6)  RTC if sxs worsen.  Current Allergies (reviewed today): MOTRIN (IBUPROFEN)

## 2010-03-04 NOTE — Letter (Signed)
Summary: [facility]  [facility]   Imported By: Lanelle Bal 10/01/2009 11:46:21  _____________________________________________________________________  External Attachment:    Type:   Image     Comment:   External Document

## 2010-03-04 NOTE — Miscellaneous (Signed)
Summary: OT Orders/[facility]  OT Orders/[facility]   Imported By: Lanelle Bal 10/03/2009 12:32:13  _____________________________________________________________________  External Attachment:    Type:   Image     Comment:   External Document

## 2010-03-04 NOTE — Progress Notes (Signed)
Summary: refill request for tramadol  Phone Note Refill Request Message from:  Fax from Pharmacy  Refills Requested: Medication #1:  TRAMADOL HCL 50 MG TABS 1/2 - 1 tab by mouth three times a day as needed for pain   Last Refilled: 07/08/2009 Faxed request from [place] pharmacy is on your desk.  Initial call taken by: Lowella Petties CMA,  September 16, 2009 8:52 AM  Follow-up for Phone Call        Rx completed in Dr. Tiajuana Amass Follow-up by: Cindee Salt MD,  September 16, 2009 1:45 PM    Prescriptions: TRAMADOL HCL 50 MG TABS (TRAMADOL HCL) 1/2 - 1 tab by mouth three times a day as needed for pain  #60 x 0   Entered and Authorized by:   Cindee Salt MD   Signed by:   Cindee Salt MD on 09/16/2009   Method used:   Electronically to        [facility]* (retail)       [address]       [place], [place]  [address]       Ph: [phone]       Fax: [phone]   RxID:   [id number]

## 2010-03-04 NOTE — Progress Notes (Signed)
Summary: refill requests for voltaren gel and nystatin  Phone Note Call from Patient Call back at Home Phone [phone] Message from:  Fax from Pharmacy  Caller: Patient Call For: Cindee Salt MD Summary of Call: Pt is asking for refills on voltaren gel for elbow pain and nystatin for psoraiasis under her breasts.  Dr. Patsy Lager prescribed voltaren gel in the past  and she was given nystatin while in the hospital.  Uses [facility]. Initial call taken by: Lowella Petties CMA,  September 20, 2009 10:42 AM  Follow-up for Phone Call        okay to prescribe 1 month of voltaren x 2 refills 1 bottle of nystatin x 1 refill confirm how she was using Follow-up by: Cindee Salt MD,  September 20, 2009 1:11 PM  Additional Follow-up for Phone Call Additional follow up Details #1::        I have called [facility], [facility] and [facility] pharmacies and neither one have ever filled nystatin for her, she was very confused about which pharmacy she uses and if that medications was ever her's? I did see on a [facility] form listing her meds, they had nystatin powder to be used on her thigh? pt states she has a tube of nystatin? Please advise. DeShannon Smith CMA Duncan Dull)  September 20, 2009 3:43 PM   I called pt back to advise that I sent the Voltaren gel over to [facility] pharmacy, but I had to leave a message on her home answering machine. DeShannon Smith CMA Duncan Dull)  September 20, 2009 4:18 PM   She can get the same type of antifungal cream over the counter. She will need to come in to show what she needs the nystatin for before we prescribe it Additional Follow-up by: Cindee Salt MD,  September 20, 2009 9:39 PM    Additional Follow-up for Phone Call Additional follow up Details #2::    patient has an appt in Sept she will talk with Dr.Letvak about it then. I also advised pt to bring in the old rx tube so we may see it. Follow-up by: Mervin Hack CMA Duncan Dull),  September 23, 2009 9:29  AM  New/Updated Medications: VOLTAREN 1 % GEL (DICLOFENAC SODIUM) apply three times a day as needed for pain. VOLTAREN 1 % GEL (DICLOFENAC SODIUM) apply 4  times a day as needed for pain. Prescriptions: VOLTAREN 1 % GEL (DICLOFENAC SODIUM) apply 4  times a day as needed for pain.  #1 x 1   Entered by:   Mervin Hack CMA (AAMA)   Authorized by:   Cindee Salt MD   Signed by:   Mervin Hack CMA (AAMA) on 09/20/2009   Method used:   Electronically to        [facility]* (retail)       [address]       [place], [place]  [address]       Ph: [phone]       Fax: [phone]   RxID:   [id number]

## 2010-03-04 NOTE — Letter (Signed)
Summary: [facility]  [facility]   Imported By: Lanelle Bal 11/25/2009 15:50:37  _____________________________________________________________________  External Attachment:    Type:   Image     Comment:   External Document  Appended Document: [facility] Pain still well controlled No further intervention for now

## 2010-03-04 NOTE — Letter (Signed)
Summary: [facility]  [facility]   Imported By: Lanelle Bal 01/03/2010 12:56:48  _____________________________________________________________________  External Attachment:    Type:   Image     Comment:   External Document  Appended Document[facility]

## 2010-03-04 NOTE — Miscellaneous (Signed)
Summary: Certification, Treatment Plan, Medication List, Orders/[facility]   Certification, Treatment Plan, Medication List, Orders/[facility]   Imported By: Maryln Gottron 09/13/2009 12:18:45  _____________________________________________________________________  External Attachment:    Type:   Image     Comment:   External Document

## 2010-03-04 NOTE — Progress Notes (Signed)
Summary: pt is going to hospital  Phone Note Call from Patient   Caller: Carla Mendez [phone] Summary of Call: Pt's son called to report that pt is going to the hospital.  Son says pt is in extreme pain, unable to move, not responding well and has lost control of her bladder.  Her home care givers have told him that that is where she needs to go.  He will have ems take her to [facility], with transfer to [facility] if necessary. Initial call taken by: Lowella Petties CMA,  July 30, 2009 2:43 PM  Follow-up for Phone Call        Please check with son about how she is doing and find out if she was admitted Follow-up by: Cindee Salt MD,  July 30, 2009 5:57 PM  Additional Follow-up for Phone Call Additional follow up Details #1::        spoke with Carla Mendez [phone], he states pt was admitted room 223 @ [facility], they did an x-ray which found a compression fracture at L-4, they're doing a CT scan and waiting for ortho to see pt. They are not sure how old the fracture is, also pt has a UTI.  Pt was seen by Dr. Enrique Sack and Dr. Buford Dresser. Son states that pt couldn't walk or move and was in extreme pain, pt is on a drip per son. Carla Mendez CMA Duncan Dull)  July 31, 2009 8:27 AM   Spoke to patient waiting for CT scan discussed--may need rehab depending on how she does Cindee Salt MD  July 31, 2009 9:06 AM   She is improving Plan is to go to [place] for rehab since her husband is still over there I asked her, or her son, to call me when she is discharged so we can arrange follow up in the office Additional Follow-up by: Cindee Salt MD,  August 02, 2009 7:59 AM

## 2010-03-04 NOTE — Letter (Signed)
Summary: [facility]   [facility]   Imported By: Beau Fanny 10/01/2009 15:05:43  _____________________________________________________________________  External Attachment:    Type:   Image     Comment:   External Document

## 2010-03-04 NOTE — Letter (Signed)
Summary: [facility]  [facility]   Imported By: Lanelle Bal 09/02/2009 10:38:15  _____________________________________________________________________  External Attachment:    Type:   Image     Comment:   External Document  Appended Document: [facility] Lumbar epidural injection and myoneural blocks done

## 2010-03-04 NOTE — Miscellaneous (Signed)
Summary: HH PT Orders/[facility]  [facility] PT Orders/[facility]   Imported By: Lanelle Bal 10/11/2009 13:56:13  _____________________________________________________________________  External Attachment:    Type:   Image     Comment:   External Document

## 2010-03-04 NOTE — Letter (Signed)
Summary: [facility]  [facility]   Imported By: Lanelle Bal 10/30/2009 12:39:55  _____________________________________________________________________  External Attachment:    Type:   Image     Comment:   External Document  Appended Document: [facility] doing well after lumbar epidural injections No further intervention now

## 2010-03-04 NOTE — Progress Notes (Signed)
Summary: pt returning to [employer] Note Call from Patient   Caller: Marisue Ivan with [facility]  [phone] Summary of Call: Pt is leaving [place] tomorrow and will be going back to [facility].  Marisue Ivan will fax medicine reconciliation sheet. Initial call taken by: Lowella Petties CMA,  August 30, 2009 3:01 PM  Follow-up for Phone Call        okay  She was not at [facility] before  but perhaps she is not ready to go home right now Cindee Salt MD  September 01, 2009 4:09 PM

## 2010-03-04 NOTE — Assessment & Plan Note (Signed)
Summary: 2 m f/u dlo   Vital Signs:  Patient profile:   77 year old female Weight:      137 pounds Temp:     98.4 degrees F oral Pulse rate:   72 / minute Pulse rhythm:   regular BP sitting:   138 / 70  (left arm) Cuff size:   regular  Vitals Entered By: Mervin Hack CMA Duncan Dull) (October 01, 2009 11:29 AM) CC: 2 month follow-up   History of Present Illness: Now in the independent living section of [facility] Has done okay there Has needed caregivers over this time and will still need for a few more weeks Caregivers help with shopping, laundry. etc Needs slight assist with shower--rolls in in wheelchair Walks for short distances with walker still working with the therapists  Had been hospitalized and then went for inpatient rehab at [facility] has recently fallen and broke both of his legs  No chest pain No palpitations Occ mild  SOB--doesn't persist No edema  Occ headaches--mostly left frontal and parietal self limited occ takes sinus med  Allergies: 1)  Motrin (Ibuprofen)  Review of Systems       Appetite was poor but improving now sleeps okay Ongoing psoriasis issues--uses topicals Occ has shaking spells but no ongoing tremor. ALso notes some change in her handwriting--gets smaller as she writes  Physical Exam  General:  alert and normal appearance.   Neck:  supple, no masses, and no cervical lymphadenopathy.   Lungs:  normal respiratory effort, no intercostal retractions, no accessory muscle use, and normal breath sounds.   Heart:  normal rate, regular rhythm, no murmur, and no gallop.   Abdomen:  soft.  Slight generalized tenderness Extremities:  no sig edema Skin:  scattered small psoriatic plaques Psych:  normally interactive, good eye contact, not anxious appearing, and not depressed appearing.     Impression & Recommendations:  Problem # 1:  DEGENERATIVE DISC DISEASE, LUMBAR SPINE (ICD-722.52) Assessment Improved had been  hospitalized then in rehab now with improving functional status continuing on therapy and has aides for assistance still Sees Dr Metta Clines also  Problem # 2:  HYPERTENSION (ICD-401.9) Assessment: Unchanged good control on single med  The following medications were removed from the medication list:    Amlodipine Besylate 5 Mg Tabs (Amlodipine besylate) .Marland Kitchen... 1 once daily Her updated medication list for this problem includes:    Lisinopril-hydrochlorothiazide 20-12.5 Mg Tabs (Lisinopril-hydrochlorothiazide) .Marland Kitchen... Take 1 tablet by mouth once a day  BP today: 138/70 Prior BP: 144/84 (08/23/2009)  Labs Reviewed: K+: 4.3 (09/06/2008) Creat: : 1.6 (09/06/2008)     Problem # 3:  ATRIAL FIBRILLATION (ICD-427.31) Assessment: Unchanged still regular on amiodarone  The following medications were removed from the medication list:    Amlodipine Besylate 5 Mg Tabs (Amlodipine besylate) .Marland Kitchen... 1 once daily Her updated medication list for this problem includes:    Amiodarone Hcl 200 Mg Tabs (Amiodarone hcl) .Marland Kitchen... Take 1/2 by mouth daily  Complete Medication List: 1)  Mobic 7.5 Mg Tabs (Meloxicam) .... Take 1 by mouth once daily 2)  Lisinopril-hydrochlorothiazide 20-12.5 Mg Tabs (Lisinopril-hydrochlorothiazide) .... Take 1 tablet by mouth once a day 3)  Amiodarone Hcl 200 Mg Tabs (Amiodarone hcl) .... Take 1/2 by mouth daily 4)  Protonix 40 Mg Tbec (Pantoprazole sodium) .... Take one by mouth daily 5)  Motion Sickness Relief Ii 25 Mg Tabs (Meclizine hcl) .... Take 1 tablet three times a day as needed 6)  Tramadol Hcl 50  Mg Tabs (Tramadol hcl) .... 1/2 - 1 tab by mouth three times a day as needed for pain 7)  Nitrostat 0.4 Mg Subl (Nitroglycerin) .Marland Kitchen.. 1 as needed 8)  Voltaren 1 % Gel (Diclofenac sodium) .... Apply 4  times a day as needed for pain. 9)  Triamcinolone Acetonide 0.1 % Crea (Triamcinolone acetonide) .... Apply to affected areas two times a day until clear  Patient Instructions: 1)   Please schedule a follow-up appointment in 3-4  months .   Current Allergies (reviewed today): MOTRIN (IBUPROFEN)

## 2010-03-04 NOTE — Progress Notes (Signed)
Summary: wants cortizone shot  Phone Note Call from Patient Call back at Home Phone [phone]   Caller: Nonda Lou- caregiver Call For: Cindee Salt MD Summary of Call: Dennie Bible called asking if patient could possibly have cotizone shot. She says that she is in excrusiating pain and that she needs some help with this. Please advise.  Initial call taken by: Melody Comas,  July 30, 2009 10:27 AM  Follow-up for Phone Call        I do not do cortisone shots in the back---these need to be done under x-ray guidance, etc needs to start with ortho  Can set up appt here to discuss stronger pain meds if the hydrocodone isn't helping and then needs the ortho appt Follow-up by: Cindee Salt MD,  July 30, 2009 10:39 AM  Additional Follow-up for Phone Call Additional follow up Details #1::        spoke with patient and she states pain meds will not help, she would like to see Ortho. Pt states she can't come in for an appt because she can hardly get up and move. Please advise. DeShannon Smith CMA Duncan Dull)  July 30, 2009 12:53 PM   check with Shirlee Limerick about how quickly we can get her in with ortho  May need to call rescue to take her to ER if really can't get up or move around Cindee Salt MD  July 30, 2009 12:58 PM   Shirlee Limerick can you help? DeShannon Smith CMA Duncan Dull)  July 30, 2009 4:31 PM   just seen phone note from Jacki Cones that pt is going to hospital DeShannon Katrinka Blazing CMA Duncan Dull)  July 30, 2009 4:32 PM   going to ER now Additional Follow-up by: Cindee Salt MD,  July 30, 2009 5:57 PM

## 2010-03-04 NOTE — Letter (Signed)
Summary: Request for Medical Clearance/[facility]  Request for Medical Clearance/[facility]   Imported By: Maryln Gottron 12/27/2009 13:16:41  _____________________________________________________________________  External Attachment:    Type:   Image     Comment:   External Document

## 2010-03-04 NOTE — Assessment & Plan Note (Signed)
Summary: 8:15  CHECK BP,FATIGUE/CLE   Vital Signs:  Patient profile:   77 year old female Weight:      137 pounds Temp:     97.8 degrees F oral Pulse rate:   82 / minute Pulse rhythm:   regular BP sitting:   144 / 84  (left arm) Cuff size:   regular  Vitals Entered ByJanee Morn CMA (August 23, 2009 8:17 AM) CC: Fatigue; Check B/p Comments In wheelchair-no weight   History of Present Illness: Hospitalized for low back pain--severe Got injection while in hospital (by Dr Emmie Niemann did provide some relief Then went to [facility] for rehab Getting therapy   Feels very tired---can be exhausted after therapy Seems to be "in a haze" the last few days Feels pressure behind base of nose Slight headache  Sleeps okay but "if they would just leave me alone" gets vital signs checked and have to check for incontinence Has been having incontinence now--- classic urge incontinence (mostly at night but some in day) Discussed scheduled voids every 90-120 minutes  Has had some dizziness over the past week occ sensation of spinning Has vague vision changes -- "I just don't see well"  Had one episode of mild chest pain---thought it was heartburn One episode of SOB---finishing up exercises. Resolved quickly but she just felt tired No syncope   Allergies: 1)  Motrin (Ibuprofen)  Past History:  Past medical, surgical, family and social histories (including risk factors) reviewed for relevance to current acute and chronic problems.  Past Medical History: Reviewed history from 09/06/2008 and no changes required. Allergic rhinitis Atrial fibrillation--------------------------------------Dr Amil Amen GERD--(past web/stricture) Hypertension Osteopenia Psoriasis----------------------------------------------Dr Purcell Nails Osteoarthritis Renal insufficiency  Past Surgical History: Reviewed history from 06/10/2006 and no changes required. Cholecystectomy 2002 Hysterectomy 1978 Bradycardia-  pacer 2004 Appendicitis 1938 Vaginal deliveries x 4 Cataracts 2000, 2001 ?Pneumonia/ tachycardia A-Fib 2003 Esophageal dilation ? 1990's Cardiolite negative 05/03 Mild LVH- ECHO 05/03 DEXA- bone density- osteopenia (-1.2) 06/05  Family History: Reviewed history from 06/10/2006 and no changes required. Father: Died at age 68, MI, kidney cancer Mother: Alive CHF Siblings: One brother, kidney stones, CABG, HTN CAD in Dad, brother  and maybe Mom No breast or colon cancer  Social History: Reviewed history from 01/02/2008 and no changes required. 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   Neck:  supple, no masses, no thyromegaly, and no cervical lymphadenopathy.   Lungs:  normal respiratory effort, no intercostal retractions, no accessory muscle use, and normal breath sounds.   Heart:  normal rate, regular rhythm, no murmur, and no gallop.   Abdomen:  soft and non-tender.   Extremities:  no edema hose on  Psych:  normally interactive, good eye contact, not anxious appearing, and not depressed appearing.     Impression & Recommendations:  Problem # 1:  DEGENERATIVE DISC DISEASE, LUMBAR SPINE (ICD-722.52) Assessment Deteriorated has required hospitalization and now rehab some help from injection from Dr Owens Loffler proceed with 2nd in series hopes to be able to go home in 1-2 weeks  Problem # 2:  HYPERTENSION (ICD-401.9) Assessment: Unchanged BP seems fine now No changes needed  Her updated medication list for this problem includes:    Lisinopril-hydrochlorothiazide 20-12.5 Mg Tabs (Lisinopril-hydrochlorothiazide) .Marland Kitchen... Take 1 tablet by mouth once a day    Amlodipine  Besylate 5 Mg Tabs (Amlodipine besylate) .Marland Kitchen... 1 once daily  BP today: 144/84 Prior BP: 150/70  (07/08/2009)  Labs Reviewed: K+: 4.3 (09/06/2008) Creat: : 1.6 (09/06/2008)     Problem # 3:  VERTIGO (ICD-780.4) Assessment: Comment Only her dizzines does seem mostly vestibular and this is not new for her should offer the meclizine if having symptoms (I instructed her to ask)  Her updated medication list for this problem includes:    Motion Sickness Relief Ii 25 Mg Tabs (Meclizine hcl) .Marland Kitchen... Take 1 tablet three times a day  Problem # 4:  ATRIAL FIBRILLATION (ICD-427.31)  Her updated medication list for this problem includes:    Amiodarone Hcl 200 Mg Tabs (Amiodarone hcl) .Marland Kitchen... Take 1/2 by mouth daily    Amlodipine Besylate 5 Mg Tabs (Amlodipine besylate) .Marland Kitchen... 1 once daily  still in sinus doesn't sound like true paroxysms when heart races (only with nerves) not clear amiodarone needs to change  Her updated medication list for this problem includes:    Amiodarone Hcl 200 Mg Tabs (Amiodarone hcl) .Marland Kitchen... Take 1/2 by mouth daily    Aspirin Ec 81 Mg Tbec (Aspirin) .Marland Kitchen... 1 tab every other day  Complete Medication List: 1)  Lisinopril-hydrochlorothiazide 20-12.5 Mg Tabs (Lisinopril-hydrochlorothiazide) .... Take 1 tablet by mouth once a day 2)  Amiodarone Hcl 200 Mg Tabs (Amiodarone hcl) .... Take 1/2 by mouth daily 3)  Protonix 40 Mg Tbec (Pantoprazole sodium) .... Take one by mouth daily 4)  Triamcinolone Acetonide 0.1 % Crea (Triamcinolone acetonide) .... Apply to affected areas two times a day until clear 5)  Motion Sickness Relief Ii 25 Mg Tabs (Meclizine hcl) .... Take 1 tablet three times a day 6)  Tramadol Hcl 50 Mg Tabs (Tramadol hcl) .... 1/2 - 1 tab by mouth three times a day as needed for pain 7)  Hydrocodone-acetaminophen 5-325 Mg Tabs (Hydrocodone-acetaminophen) .... One or two tablets every 4 hours as needed. 8)  Amlodipine Besylate 5 Mg Tabs (Amlodipine besylate) .Marland Kitchen.. 1 once daily 9)  Flexeril 5 Mg Tabs (Cyclobenzaprine hcl) .Marland Kitchen.. 1 every 8 hours as needed spasm 10)   Risperdal 0.25 Mg Tabs (Risperidone) .Marland Kitchen.. 1 at bedtime as needed 11)  Nitrostat 0.4 Mg Subl (Nitroglycerin) .Marland Kitchen.. 1 as needed  Patient Instructions: 1)  Please schedule a follow-up appointment in 1-2  months.  Current Allergies (reviewed today): MOTRIN (IBUPROFEN)

## 2010-03-04 NOTE — Progress Notes (Signed)
Summary: back not any better  Phone Note Call from Patient Call back at Home Phone [phone]   Summary of Call: Patient says that her back is not any better. She is in alot of pain and yesterday was really bad.. She says that now the pain is on both sides. She wants to know if she can get the physical therapy set up.  Initial call taken by: Melody Comas,  July 16, 2009 10:34 AM  Follow-up for Phone Call        okay to make PT referral Follow-up by: Cindee Salt MD,  July 16, 2009 10:49 AM

## 2010-03-04 NOTE — Progress Notes (Signed)
Summary: mobic  Phone Note Refill Request Message from:  [employer]line on October 21, 2009 9:05 AM  Refills Requested: Medication #1:  MOBIC 7.5 MG TABS take 1 by mouth once daily   Supply Requested: 3 months   Last Refilled: 03/16/2009 [facility] [phone]   Method Requested: Electronic Initial call taken by: Benny Lennert CMA Duncan Dull),  October 21, 2009 9:06 AM  Follow-up for Phone Call        okay to send #30 x 3  Additional Follow-up for Phone Call Additional follow up Details #1::        Rx faxed to pharmacy Additional Follow-up by: DeShannon Katrinka Blazing CMA Duncan Dull),  October 21, 2009 2:42 PM    Prescriptions: MOBIC 7.5 MG TABS (MELOXICAM) take 1 by mouth once daily  #30 x 3   Entered by:   Mervin Hack CMA (AAMA)   Authorized by:   Cindee Salt MD   Signed by:   Mervin Hack CMA (AAMA) on 10/21/2009   Method used:   Electronically to        [facility]* (retail)       [address]       [place], [place]  [address]       Ph: [phone]       Fax: [phone]   RxID:   [id number]

## 2010-03-04 NOTE — Miscellaneous (Signed)
Summary: Order/[facility]  Order/[facility]   Imported By: Sherian Rein 10/18/2009 09:07:53  _____________________________________________________________________  External Attachment:    Type:   Image     Comment:   External Document

## 2010-03-04 NOTE — Letter (Signed)
Summary: Addendum/[facility]  Addendum/[facility]   Imported By: Lanelle Bal 08/09/2009 13:53:36  _____________________________________________________________________  External Attachment:    Type:   Image     Comment:   External Document

## 2010-03-04 NOTE — Assessment & Plan Note (Signed)
Summary: PAIN L BUTTOCK/CLE   Vital Signs:  Patient profile:   77 year old female Weight:      145 pounds Temp:     98.0 degrees F oral BP sitting:   150 / 70  (left arm) Cuff size:   regular  Vitals Entered By: Mervin Hack CMA Duncan Dull) (July 08, 2009 12:59 PM) CC: PAIN DOWN LEFT SIDE INTO BUTTOCKS   History of Present Illness: Started with buttock pain about 10 days was carrying things in her arms---may have strained herself some but doesn't remember injury Worsening pain over the initial few days not grabbing but severe in left lower buttock "aches"  can flex at hip without pain on left some contralateral pain with flexion at right hip  No leg weakness seems to improve after being on it for a while  Allergies: 1)  Motrin (Ibuprofen)  Past History:  Past medical, surgical, family and social histories (including risk factors) reviewed for relevance to current acute and chronic problems.  Past Medical History: Reviewed history from 09/06/2008 and no changes required. Allergic rhinitis Atrial fibrillation--------------------------------------Dr Amil Amen GERD--(past web/stricture) Hypertension Osteopenia Psoriasis----------------------------------------------Dr Purcell Nails Osteoarthritis Renal insufficiency  Past Surgical History: Reviewed history from 06/10/2006 and no changes required. Cholecystectomy 2002 Hysterectomy 1978 Bradycardia- pacer 2004 Appendicitis 1938 Vaginal deliveries x 4 Cataracts 2000, 2001 ?Pneumonia/ tachycardia A-Fib 2003 Esophageal dilation ? 1990's Cardiolite negative 05/03 Mild LVH- ECHO 05/03 DEXA- bone density- osteopenia (-1.2) 06/05  Family History: Reviewed history from 06/10/2006 and no changes required. Father: Died at age 66, MI, kidney cancer Mother: Alive CHF Siblings: One brother, kidney stones, CABG, HTN CAD in Dad, brother  and maybe Mom No breast or colon cancer  Social History: Reviewed history from 01/02/2008  and no changes required. 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  NAD Msk:  mild back tenderness along lumbar spine no spasm no tenderness along left buttock  SLR causes some pain but not diagnostic Extremities:  no edema Neurologic:  no focal weakness very slow gait --uses cane   Impression & Recommendations:  Problem # 1:  SCIATICA (ICD-724.3) Assessment New seems to have radiculopathy no focal weakness or worrisome neuro findings may be disc related but unclear  P: will try heat and tramadol     essentially homebound now due to limited mobility    if not better in 1-2 weeks, will consider PT to work on pain and movement deficits  Her updated medication list for this problem includes:    Tramadol Hcl 50 Mg Tabs (Tramadol hcl) .Marland Kitchen... 1/2 - 1 tab by mouth three times a day as needed for pain  Complete Medication List: 1)  Lisinopril-hydrochlorothiazide 20-12.5 Mg Tabs (Lisinopril-hydrochlorothiazide) .... Take 1 tablet by mouth once a day 2)  Amiodarone Hcl 200 Mg Tabs (Amiodarone hcl) .... Take 1/2 by mouth daily 3)  Protonix 40 Mg Tbec (Pantoprazole sodium) .... Take one by mouth daily 4)  Triamcinolone Acetonide 0.1 % Crea (Triamcinolone acetonide) .... Apply to affected areas two times a day until clear 5)  Motion Sickness Relief Ii 25 Mg Tabs (Meclizine hcl) .... Take 1 tablet three times a day 6)  Tramadol Hcl 50 Mg Tabs (Tramadol hcl) .... 1/2 - 1 tab by mouth three times a day as needed for pain  Patient Instructions: 1)  Please try heat on  painful area and over lower back 2)  Please continue tylenol 3)  Try the tramadol for times when pain is severe 4)  Please call in 1-2 weeks if not improved and we will set up physical therapy Prescriptions: TRAMADOL HCL 50 MG TABS (TRAMADOL HCL) 1/2 - 1 tab  by mouth three times a day as needed for pain  #60 x 0   Entered and Authorized by:   Cindee Salt MD   Signed by:   Cindee Salt MD on 07/08/2009   Method used:   Electronically to        [facility]* (retail)       [address]       [place], [place]  [address]       Ph: [phone]       Fax: [phone]   RxID:   [id number]   Current Allergies (reviewed today): MOTRIN (IBUPROFEN)

## 2010-03-04 NOTE — Letter (Signed)
Summary: Dr[facility]Gregory [facility]  Dr[facility]Gregory [facility]   Imported By: Beau Fanny 08/22/2009 14:33:46  _____________________________________________________________________  External Attachment:    Type:   Image     Comment:   External Document

## 2010-03-04 NOTE — Progress Notes (Signed)
Summary: Discharged from PT  Phone Note From Other Clinic Call back at [phone]   Caller: Cristela Blue Call For: Dr. Alphonsus Sias Summary of Call: Calling to let you know that he is discharging patient today because she has reached her goals Initial call taken by: Sydell Axon LPN,  October 16, 2009 5:37 PM  Follow-up for Phone Call        noted Follow-up by: Cindee Salt MD,  October 16, 2009 7:26 PM

## 2010-03-04 NOTE — Progress Notes (Signed)
Summary: needs verbal ok for physical therapy  Phone Note From Other Clinic   Caller: Gerhard Perches, PT with [facility] [phone] Summary of Call: Physical therapist is asking for verbal ok to get pt started with therepy.  He is planning to work with pt for 3 weeks. Initial call taken by: Lowella Petties CMA,  September 06, 2009 11:26 AM  Follow-up for Phone Call        can give verbal okay to proceed Cindee Salt MD  September 06, 2009 9:59 PM   Additional Follow-up for Phone Call Additional follow up Details #1::        Trey Paula notified as instructed by telephone. Additional Follow-up by: Sydell Axon LPN,  September 09, 2009 10:17 AM

## 2010-03-04 NOTE — Progress Notes (Signed)
Summary: PT wants to extend therapy  Phone Note From Other Clinic   Caller: Gerhard Perches, PT with [facility]   [phone] Summary of Call: Physical therapist would like to extend pt's therapy for another 3 weeks.  He says she is making progress, but he wants to continue to work with her on her balance.  Verbal order is ok. Initial call taken by: Lowella Petties CMA,  September 26, 2009 11:26 AM  Follow-up for Phone Call        okay to continue Follow-up by: Cindee Salt MD,  September 26, 2009 12:35 PM  Additional Follow-up for Phone Call Additional follow up Details #1::        Advised Trey Paula. Additional Follow-up by: Lowella Petties CMA,  September 26, 2009 2:20 PM

## 2010-03-04 NOTE — Progress Notes (Signed)
Summary: pain has gotten worse  Phone Note Call from Patient Call back at Home Phone [phone] Call back at [phone]   Caller: Patient Call For: Cindee Salt MD Summary of Call: The pain that she has been having jn her lower buttock has gotten worse and she can hardly take it in anymore. She wants to know if it would be a good idea to see a neurololgist and if so could you give her a referral. Please advise.  Initial call taken by: Melody Comas,  July 30, 2009 9:15 AM  Follow-up for Phone Call        probably best to start at orthopedist getting in with neurologist would take a long time and I think the pain is either from hip or back arthritis set up with ortho in [place] Follow-up by: Cindee Salt MD,  July 30, 2009 10:10 AM

## 2010-03-04 NOTE — Miscellaneous (Signed)
Summary: Missed Visit/[facility]  Missed Visit/[facility]   Imported By: Lanelle Bal 10/29/2009 08:49:32  _____________________________________________________________________  External Attachment:    Type:   Image     Comment:   External Document

## 2010-03-06 ENCOUNTER — Other Ambulatory Visit (INDEPENDENT_AMBULATORY_CARE_PROVIDER_SITE_OTHER): Payer: Medicare Other

## 2010-03-06 ENCOUNTER — Encounter (INDEPENDENT_AMBULATORY_CARE_PROVIDER_SITE_OTHER): Payer: Self-pay | Admitting: *Deleted

## 2010-03-06 ENCOUNTER — Ambulatory Visit: Admit: 2010-03-06 | Payer: Self-pay | Admitting: Internal Medicine

## 2010-03-06 ENCOUNTER — Other Ambulatory Visit: Payer: Self-pay | Admitting: Internal Medicine

## 2010-03-06 DIAGNOSIS — N259 Disorder resulting from impaired renal tubular function, unspecified: Secondary | ICD-10-CM

## 2010-03-06 LAB — RENAL FUNCTION PANEL
Glucose, Bld: 92 mg/dL (ref 70–99)
Sodium: 142 mEq/L (ref 135–145)

## 2010-03-06 NOTE — Assessment & Plan Note (Signed)
Summary: NP6/AMD   Visit Type:  Initial Consult Primary Provider:  Dr. Alphonsus Sias  CC:  "doing well" denies chest pain and SOB.  History of Present Illness: Carla Mendez is a pleasant 77 year old woman, patient of Dr. Alphonsus Sias, who has previously been seen by Dr. Ty Hilts at [facility] cardiology who is transferring care to our office with a history of sick sinus syndrome, hypertension, pacemaker placement in January 2004, chronic low back pain with history of cortisone shots, paroxysmal atrial fibrillation.  her notes are not available to [place] from [facility] cardiology and we have asked for these records to be sent. She denies any new symptoms. She does urinate quite frequently at nighttime. She denies any tachycardia palpitations, dizziness, lightheadedness. She has not had to have a pacemaker change out for new battery since it was implanted.  She is scheduled to have her pacemaker checked February 6 at home electronically and then in June in the office.   Notes indicate that her creatinine at the end of December was 1.9 with an elevated BUN. Her family reports that she does not drink much for the patient. Instructions were for her to hold her nonsteroidal anti-inflammatory which she reports she is not happy about due to chronic back pain.  EKG shows normal sinus rhythm with rate of 63 beats per minute  Current Medications (verified): 1)  Mobic 7.5 Mg Tabs (Meloxicam) .... Take 1 By Mouth Once Daily 2)  Lisinopril-Hydrochlorothiazide 20-12.5 Mg Tabs (Lisinopril-Hydrochlorothiazide) .... Take 1 Tablet By Mouth Once A Day 3)  Amiodarone Hcl 200 Mg Tabs (Amiodarone Hcl) .... Take 1 By Mouth Daily 4)  Protonix 40 Mg Tbec (Pantoprazole Sodium) .... Take One By Mouth Daily 5)  Tramadol Hcl 50 Mg Tabs (Tramadol Hcl) .... 1/2 - 1 Tab By Mouth Three Times A Day As Needed For Pain 6)  Triamcinolone Acetonide 0.1 % Crea (Triamcinolone Acetonide) .... Apply To Affected Areas Two Times A Day Until Clear 7)   Methotrexate 2.5 Mg Tabs (Methotrexate Sodium) .... Take 2 By Mouth Once A Week 8)  Folic Acid 1 Mg Tabs (Folic Acid) .... Take 1 By Mouth Once Daily Except On Days When Taking Methotrexate 9)  Vitamin D 1000 Unit Tabs (Cholecalciferol) .... Once Daily 10)  [facility]vs Daytime Sinus Relief 5-325 Mg Caps (Phenylephrine-Acetaminophen) .... As Needed 11)  Meclizine Hcl 25 Mg Tabs (Meclizine Hcl) .Marland Kitchen.. 1 Tablet Three Times A Day As Needed  Allergies (verified): 1)  Motrin (Ibuprofen)  Past History:  Past Medical History: Last updated: 09/06/2008 Allergic rhinitis Atrial fibrillation--------------------------------------Dr Amil Amen GERD--(past web/stricture) Hypertension Osteopenia Psoriasis----------------------------------------------Dr Purcell Nails Osteoarthritis Renal insufficiency  Past Surgical History: Last updated: 2006/07/06 Cholecystectomy 2002 Hysterectomy 1978 Bradycardia- pacer 2004 Appendicitis 1938 Vaginal deliveries x 4 Cataracts 2000, 2001 ?Pneumonia/ tachycardia A-Fib 2003 Esophageal dilation ? 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    Vital Signs:  Patient profile:  77 year old female Height:      62 inches Weight:      132.75  pounds BMI:     24.37 Pulse rate:   63 / minute BP sitting:   104 / 62  (left arm) Cuff size:   regular  Vitals Entered By: Lysbeth Galas CMA (February 19, 2010 10:05 AM)  Physical Exam  General:  Well developed, well nourished, in no acute distress. Head:  normocephalic and atraumatic Neck:  Neck supple, no JVD. No masses, thyromegaly or abnormal cervical nodes. Lungs:  Clear bilaterally to auscultation and percussion. Heart:  Non-displaced PMI, chest non-tender; regular rate and rhythm, S1, S2 without murmurs, rubs or gallops. Carotid upstroke normal, no bruit.  Pedals normal pulses. No edema, no varicosities. Abdomen:  Bowel sounds positive; abdomen soft and non-tender without masses Msk:  Back normal, normal gait. Muscle strength and tone normal. Pulses:  pulses normal in all 4 extremities Extremities:  No clubbing or cyanosis. Neurologic:  Alert and oriented x 3. Skin:  Intact without lesions or rashes. Psych:  Normal affect.   Impression & Recommendations:  Problem # 1:  BRADYCARDIA (ICD-427.89) Notes indicate history of sick sinus syndrome, tachybradycardia syndrome with pacemaker placement in 2004. We will set her up to be seen by EP in our office. We'll try to obtain her old records. It may be possible to decrease her amiodarone to 100 mg daily  The following medications were removed from the medication list:    Nitrostat 0.4 Mg Subl (Nitroglycerin) .Marland Kitchen... 1 as needed Her updated medication list for this problem includes:    Lisinopril 20 Mg Tabs (Lisinopril) .Marland Kitchen... Take one tablet by mouth daily    Amiodarone Hcl 200 Mg Tabs (Amiodarone hcl) .Marland Kitchen... Take 1 by mouth daily  Orders: EKG w/ Interpretation (93000)  Problem # 2:  RENAL INSUFFICIENCY (ICD-588.9) I'm concerned that she may be hypovolemic given her recent climb in her BUN and creatinine. Her blood pressure is borderline low and I will change her lisinopril HCT to  lisinopril 20 mg daily. if she continues to have  a elevated creatinine, we could change the lisinopril to an alternate blood pressure medication to avoid ACE inhibitors. I've encouraged her to drink more fluids.  Problem # 3:  HYPERTENSION (ICD-401.9) we'll change her medication to lisinopril 20 mg daily and follow her creatinine.  Her updated medication list for this problem includes:    Lisinopril 20 Mg Tabs (Lisinopril) .Marland Kitchen... Take one tablet by mouth daily  Problem # 4:  ATRIAL FIBRILLATION (ICD-427.31) Notes indicate a very short period of atrial fibrillation and we will evaluate her pacemaker interrogation when it comes to our office. Ideally, we like to decrease the amiodarone to 100 mg daily.  Her updated medication list for this problem includes:    Amiodarone Hcl 200 Mg Tabs (Amiodarone hcl) .Marland Kitchen... Take 1 by mouth daily  Patient Instructions: 1)  Your physician recommends that you schedule a follow-up appointment in: 6 months 2)  Your physician has recommended you make the following change in your medication: STOP Lisinopril/HCTZ. START Lisinopril 20mg  once daily. INCREASE Fluid intake. Prescriptions: LISINOPRIL 20 MG TABS (LISINOPRIL) Take one tablet by mouth daily  #30 x 6   Entered by:   Lanny Hurst RN   Authorized by:   Dossie Arbour MD   Signed by:   Lanny Hurst RN on 02/19/2010   Method used:   Electronically to        [facility]* (retail)  [address]       [place], [place]  [address]       Ph: [phone]       Fax: [phone]   RxID:   [id number]   Appended Document: NP6/AMD Ideally, we would like to start her on aspirin 81 mg daily. We will first monitor her creatinine closely before starting this.

## 2010-03-06 NOTE — Letter (Signed)
Summary: Medical Record Release  Medical Record Release   Imported By: Harlon Flor 02/25/2010 13:42:26  _____________________________________________________________________  External Attachment:    Type:   Image     Comment:   External Document

## 2010-03-06 NOTE — Assessment & Plan Note (Signed)
Summary: FOLLOW UP / LFW   Vital Signs:  Patient profile:   77 year old female Weight:      136 pounds BMI:     24.96 O2 Sat:      96 % on Room air Temp:     98.0 degrees F oral Pulse rate:   54 / minute Pulse rhythm:   regular BP sitting:   158 / 70  (left arm) Cuff size:   regular  Vitals Entered By: Mervin Hack CMA Duncan Dull) (January 31, 2010 12:37 PM)  O2 Flow:  Room air CC: 4 month follow-up   History of Present Illness: Doing pretty well Has noticed some change in her handwriting---gets smaller at the end. Worried about Parkinsons Very slight tremor No noticeable bradykinesia  back is much better did have another infection and had follow up yesterday Dr Mia Creek tramadol 25 two times a day regularly---she prefers  as needed   Heart seems to be okay recent evaluation okay but did have 19 beat episode of atrial fib apparently No palpitations No chest pain No SOB  SOme nasal congestion and hoarseness may have a touch of a cold  Allergies: 1)  Motrin (Ibuprofen)  Past History:  Past medical, surgical, family and social histories (including risk factors) reviewed for relevance to current acute and chronic problems.  Past Medical History: Reviewed history from 09/06/2008 and no changes required. Allergic rhinitis Atrial fibrillation--------------------------------------Dr Amil Amen GERD--(past web/stricture) Hypertension Osteopenia Psoriasis----------------------------------------------Dr Purcell Nails Osteoarthritis Renal insufficiency  Past Surgical History: Reviewed history from 06/10/2006 and no changes required. Cholecystectomy 2002 Hysterectomy 1978 Bradycardia- pacer 2004 Appendicitis 1938 Vaginal deliveries x 4 Cataracts 2000, 2001 ?Pneumonia/ tachycardia A-Fib 2003 Esophageal dilation ? 1990's Cardiolite negative 05/03 Mild LVH- ECHO 05/03 DEXA- bone density- osteopenia (-1.2) 06/05  Family History: Reviewed history from  06/10/2006 and no changes required. Father: Died at age 69, MI, kidney cancer Mother: Alive CHF Siblings: One brother, kidney stones, CABG, HTN CAD in Dad, brother  and maybe Mom No breast or colon cancer  Social History: Reviewed history from 01/02/2008 and no changes required. 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  NAD Neck:  supple, no masses, no thyromegaly, and no cervical lymphadenopathy.   Lungs:  normal respiratory effort, no intercostal retractions, no accessory muscle use, and normal breath sounds.   Heart:  normal rate, regular rhythm, no murmur, and no gallop.   Abdomen:  soft and non-tender.   Extremities:  no edema Psych:  normally interactive, good eye contact, not anxious appearing, and not depressed appearing.     Impression & Recommendations:  Problem # 1:  HYPERTENSION (ICD-401.9) Assessment Unchanged good control will recheck renal function  Her updated medication list for this problem includes:    Lisinopril-hydrochlorothiazide 20-12.5 Mg Tabs (Lisinopril-hydrochlorothiazide) .Marland Kitchen... Take 1 tablet by mouth once a day  BP today: 158/70 Prior BP: 138/70 (10/01/2009)  Labs Reviewed: K+: 4.3 (09/06/2008) Creat: : 1.6 (09/06/2008)     Problem # 2:  ATRIAL FIBRILLATION (ICD-427.31) Assessment: Unchanged  regular still on amiodarone  Her updated medication list for this problem includes:    Amiodarone Hcl 200 Mg Tabs (Amiodarone hcl) .Marland Kitchen... Take 1 by mouth daily  Orders: Venipuncture ([address]) TLB-Renal Function Panel (80069-RENAL) TLB-CBC Platelet - w/Differential (85025-CBCD) TLB-T4 (Thyrox), Free [phone]) Cardiology Referral (Cardiology)  Problem # 3:  DEGENERATIVE DISC DISEASE, LUMBAR SPINE (ICD-722.52) Assessment: Improved pain control better uses the  tramadol as needed   Problem # 4:  GERD (PAST STRICTURE) (ICD-530.81) Assessment: Unchanged okay on the med  Her updated medication list for this problem includes:    Protonix 40 Mg Tbec (Pantoprazole sodium) .Marland Kitchen... Take one by mouth daily  Complete Medication List: 1)  Mobic 7.5 Mg Tabs (Meloxicam) .... Take 1 by mouth once daily 2)  Lisinopril-hydrochlorothiazide 20-12.5 Mg Tabs (Lisinopril-hydrochlorothiazide) .... Take 1 tablet by mouth once a day 3)  Amiodarone Hcl 200 Mg Tabs (Amiodarone hcl) .... Take 1 by mouth daily 4)  Protonix 40 Mg Tbec (Pantoprazole sodium) .... Take one by mouth daily 5)  Tramadol Hcl 50 Mg Tabs (Tramadol hcl) .... 1/2 - 1 tab by mouth three times a day as needed for pain 6)  Nitrostat 0.4 Mg Subl (Nitroglycerin) .Marland Kitchen.. 1 as needed 7)  Triamcinolone Acetonide 0.1 % Crea (Triamcinolone acetonide) .... Apply to affected areas two times a day until clear 8)  Methotrexate 2.5 Mg Tabs (Methotrexate sodium) .... Take 2 by mouth once a week 9)  Folic Acid 1 Mg Tabs (Folic acid) .... Take 1 by mouth once daily except on days when taking methotrexate 10)  Vitamin D 1000 Unit Tabs (Cholecalciferol) .... Once daily 11)  [facility]vs Daytime Sinus Relief 5-325 Mg Caps (Phenylephrine-acetaminophen) .... As needed  Patient Instructions: 1)  Please schedule a follow-up appointment in 4 months .  2)  Referral Appointment Information 3)  Day/Date: 4)  Time: 5)  Place/MD: 6)  Address: 7)  Phone/Fax: 8)  Patient given appointment information. Information/Orders faxed/mailed.   Orders Added: 1)  Est. Patient Level IV [528[address]] 2)  Venipuncture [364[address]] 3)  TLB-Renal Function Panel [80069-RENAL] 4)  TLB-CBC Platelet - w/Differential [85025-CBCD] 5)  TLB-T4 (Thyrox), Free [32440-NU2V] 6)  Cardiology Referral [Cardiology]    Current Allergies (reviewed today): MOTRIN (IBUPROFEN)

## 2010-03-10 ENCOUNTER — Telehealth (INDEPENDENT_AMBULATORY_CARE_PROVIDER_SITE_OTHER): Payer: Self-pay | Admitting: *Deleted

## 2010-03-17 ENCOUNTER — Encounter: Payer: Self-pay | Admitting: Internal Medicine

## 2010-03-17 ENCOUNTER — Ambulatory Visit: Payer: Medicare Other | Admitting: Internal Medicine

## 2010-03-17 DIAGNOSIS — S139XXA Sprain of joints and ligaments of unspecified parts of neck, initial encounter: Secondary | ICD-10-CM

## 2010-03-17 DIAGNOSIS — L02519 Cutaneous abscess of unspecified hand: Secondary | ICD-10-CM | POA: Insufficient documentation

## 2010-03-17 DIAGNOSIS — L03119 Cellulitis of unspecified part of limb: Secondary | ICD-10-CM

## 2010-03-17 LAB — CONVERTED CEMR LAB
Bilirubin Urine: NEGATIVE
Glucose, Urine, Semiquant: NEGATIVE
Ketones, urine, test strip: NEGATIVE
Protein, U semiquant: 100
pH: 7

## 2010-03-18 DIAGNOSIS — R5383 Other fatigue: Secondary | ICD-10-CM | POA: Insufficient documentation

## 2010-03-18 DIAGNOSIS — R5381 Other malaise: Secondary | ICD-10-CM | POA: Insufficient documentation

## 2010-03-20 ENCOUNTER — Telehealth: Payer: Self-pay | Admitting: Internal Medicine

## 2010-03-20 ENCOUNTER — Other Ambulatory Visit: Payer: Self-pay | Admitting: Emergency Medicine

## 2010-03-20 NOTE — Progress Notes (Signed)
Summary: Call in Pacer  Phone Note Call from Patient Call back at Home Phone [phone]   Caller: SELF Call For: Gollan Summary of Call: Would like to know if we are going to have her call in pacer switched from her old doctor to here.  She is due to make a call in and the old physician's office is contacting her. Initial call taken by: Harlon Flor,  March 10, 2010 10:13 AM  Follow-up for Phone Call        Spoke with patient explained that we will resume her TTM's with [facility] after her initial visit with Dr. Graciela Husbands 2/24 @ 10:45am. Follow-up by: Altha Harm, LPN,  March 10, 2010 3:38 PM

## 2010-03-24 ENCOUNTER — Encounter: Payer: Self-pay | Admitting: Internal Medicine

## 2010-03-26 ENCOUNTER — Telehealth: Payer: Self-pay | Admitting: Internal Medicine

## 2010-03-26 NOTE — Progress Notes (Signed)
Summary: neck and knee pain  Phone Note Call from Patient Call back at Home Phone [phone] P PH     Caller: Patient Call For: Carla Salt MD Summary of Call: Patient called to let you know that her soreness around her neck is not doing any better and her knee is also no  better. She is asking for referral for physical therapy. Initial call taken by: Melody Comas,  March 20, 2010 9:53 AM  Follow-up for Phone Call        okay to make referral She can go to them for Rx Find out where she would like to go Carla Salt MD  March 20, 2010 12:31 PM

## 2010-03-26 NOTE — Assessment & Plan Note (Signed)
Summary: Not feeling well   Vital Signs:  Patient profile:   77 year old female Weight:      133 pounds Temp:     98.7 degrees F oral Pulse rate:   67 / minute Pulse rhythm:   regular BP sitting:   187 / 78  (left arm) Cuff size:   regular  Vitals Entered By: Mervin Hack CMA Duncan Dull) (March 17, 2010 2:20 PM) CC: stiff neck, congestion, right hand swollen   History of Present Illness: Having neck soreness--can't turn her head Thought it might be from sleeping wrong (had used 2 pillows) started 3 days ago Pain also shot up to forehead  Soreness along left neck and then to the right  Tried her tramadol---no help Tried heat, then cold water and massages. No apparent benefit though felt good temporarily  Not sick had very brief dizziness today--nothing persistent  Also pain in right hand increasing redness no known injury no fever  Allergies: 1)  Motrin (Ibuprofen)  Past History:  Past medical, surgical, family and social histories (including risk factors) reviewed for relevance to current acute and chronic problems.  Past Medical History: Reviewed history from 09/06/2008 and no changes required. Allergic rhinitis Atrial fibrillation--------------------------------------Dr Amil Amen GERD--(past web/stricture) Hypertension Osteopenia Psoriasis----------------------------------------------Dr Purcell Nails Osteoarthritis Renal insufficiency  Past Surgical History: Reviewed history from 06/10/2006 and no changes required. Cholecystectomy 2002 Hysterectomy 1978 Bradycardia- pacer 2004 Appendicitis 1938 Vaginal deliveries x 4 Cataracts 2000, 2001 ?Pneumonia/ tachycardia A-Fib 2003 Esophageal dilation ? 1990's Cardiolite negative 05/03 Mild LVH- ECHO 05/03 DEXA- bone density- osteopenia (-1.2) 06/05  Family History: Reviewed history from 06/10/2006 and no changes required. Father: Died at age 78, MI, kidney cancer Mother: Alive CHF Siblings: One brother,  kidney stones, CABG, HTN CAD in Dad, brother  and maybe Mom No breast or colon cancer  Social History: Reviewed history from 01/02/2008 and no changes required. 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  NAD but is uncomfortable moving Mouth:  no erythema and no exudates.   Neck:  no masses and no cervical lymphadenopathy.   Very sensitive to touch Almost no extension---limited flexion and rotation Skin:  redness, warmth and tenderness in dorsum of right hand from PIP to wrist along ulnar side   Impression & Recommendations:  Problem # 1:  CELLULITIS, RIGHT HAND (ICD-682.4) Assessment New  no clear entry point or wound will treat with keflex  Her updated medication list for this problem includes:    Cephalexin 500 Mg Caps (Cephalexin) .Marland Kitchen... 1 capsule three times a day for infection in hand  Problem # 2:  NECK SPRAIN AND STRAIN (ICD-847.0) Assessment: New seems related to sleep position but not clear cut No infectious cause noted no nodes  will try heat PT if not improving  Her updated medication list for this problem includes:    Mobic 7.5 Mg Tabs (Meloxicam) .Marland Kitchen... Take 1 by mouth once daily    Tramadol Hcl 50 Mg Tabs (Tramadol hcl) .Marland Kitchen... 1/2 - 1 tab by mouth three times a day as needed for pain  Complete Medication List: 1)  Mobic 7.5 Mg Tabs (Meloxicam) .... Take 1 by mouth once daily 2)  Lisinopril 20 Mg Tabs (Lisinopril) .... Take one tablet by mouth daily 3)  Amiodarone Hcl 200 Mg Tabs (Amiodarone hcl) .... Take  1 by mouth daily 4)  Protonix 40 Mg Tbec (Pantoprazole sodium) .... Take one by mouth daily 5)  Tramadol Hcl 50 Mg Tabs (Tramadol hcl) .... 1/2 - 1 tab by mouth three times a day as needed for pain 6)  Triamcinolone Acetonide 0.1 % Crea (Triamcinolone acetonide) .... Apply to affected  areas two times a day until clear 7)  Methotrexate 2.5 Mg Tabs (Methotrexate sodium) .... Take 2 by mouth once a week 8)  Folic Acid 1 Mg Tabs (Folic acid) .... Take 1 by mouth once daily except on days when taking methotrexate 9)  Vitamin D 1000 Unit Tabs (Cholecalciferol) .... Once daily 10)  Cvs Daytime Sinus Relief 5-325 Mg Caps (Phenylephrine-acetaminophen) .... As needed 11)  Meclizine Hcl 25 Mg Tabs (Meclizine hcl) .Marland Kitchen.. 1 tablet three times a day as needed 12)  Cephalexin 500 Mg Caps (Cephalexin) .Marland Kitchen.. 1 capsule three times a day for infection in hand  Patient Instructions: 1)  Please take the antibioitic for your hand. 2)  Please try heat for your neck and call by the end of the week if not better (will make referral for physical therapy) 3)  Try Vick's vaporub for the congestion 4)  Keep your April 25th appt Prescriptions: CEPHALEXIN 500 MG CAPS (CEPHALEXIN) 1 capsule three times a day for infection in hand  #21 x 0   Entered and Authorized by:   Cindee Salt MD   Signed by:   Cindee Salt MD on 03/17/2010   Method used:   Electronically to        [facility]* (retail)       [address]       [place], [place]  [address]       Ph: [phone]       Fax: [phone]   RxID:   [id number]    Orders Added: 1)  Est. Patient Level IV [[address]]    Current Allergies (reviewed today): MOTRIN (IBUPROFEN)  Laboratory Results   Urine Tests  Date/Time Received: March 17, 2010 2:30 PM Date/Time Reported: March 17, 2010 2:30 PM  Routine Urinalysis   Color: orange Appearance: Cloudy Glucose: negative   (Normal Range: Negative) Bilirubin: negative   (Normal Range: Negative) Ketone: negative   (Normal Range: Negative) Spec. Gravity: <1.005   (Normal Range: 1.003-1.035) Blood: large   (Normal Range: Negative) pH: 7.0   (Normal Range: 5.0-8.0) Protein: 100   (Normal Range: Negative) Urobilinogen: 0.2   (Normal Range: 0-1) Nitrite: positive    (Normal Range: Negative) Leukocyte Esterace: trace   (Normal Range: Negative)

## 2010-03-28 ENCOUNTER — Institutional Professional Consult (permissible substitution): Payer: Self-pay | Admitting: Internal Medicine

## 2010-04-01 ENCOUNTER — Ambulatory Visit (INDEPENDENT_AMBULATORY_CARE_PROVIDER_SITE_OTHER): Payer: Medicare Other | Admitting: Internal Medicine

## 2010-04-01 ENCOUNTER — Other Ambulatory Visit: Payer: Self-pay | Admitting: Internal Medicine

## 2010-04-01 ENCOUNTER — Encounter: Payer: Self-pay | Admitting: Internal Medicine

## 2010-04-01 DIAGNOSIS — I10 Essential (primary) hypertension: Secondary | ICD-10-CM

## 2010-04-01 DIAGNOSIS — R5381 Other malaise: Secondary | ICD-10-CM

## 2010-04-01 LAB — RENAL FUNCTION PANEL
Calcium: 8.7 mg/dL (ref 8.4–10.5)
Chloride: 104 mEq/L (ref 96–112)
Sodium: 140 mEq/L (ref 135–145)

## 2010-04-01 NOTE — Progress Notes (Signed)
Summary: Pts husband passed.....  Phone Note Outgoing Call   Summary of Call: Dr. Gwendlyn Deutscher, Mrs  Sarchet's husband passed away, funeral services is on tomorrow, I was calling the pts regard a referral..Marland KitchenMarland KitchenDaine Gip  March 26, 2010 11:27 AM  Initial call taken by: Daine Gip,  March 26, 2010 11:27 AM  Follow-up for Phone Call        Yes, I saw the obituary Awaiting details in paper as I didn't get to look at it this AM Follow-up by: Cindee Salt MD,  March 26, 2010 11:50 AM

## 2010-04-03 ENCOUNTER — Encounter: Payer: Self-pay | Admitting: Internal Medicine

## 2010-04-08 ENCOUNTER — Encounter: Payer: Self-pay | Admitting: Internal Medicine

## 2010-04-08 LAB — HM MAMMOGRAPHY

## 2010-04-09 ENCOUNTER — Encounter: Payer: Self-pay | Admitting: Internal Medicine

## 2010-04-10 NOTE — Letter (Signed)
Summary: [facility]  [facility]   Imported By: Maryln Gottron 03/31/2010 10:17:39  _____________________________________________________________________  External Attachment:    Type:   Image     Comment:   External Document

## 2010-04-10 NOTE — Assessment & Plan Note (Signed)
Summary: F/U [facility]  D/C 03/24/10/[facility]  [facility]   Vital Signs:  Patient profile:   77 year old female Height:      62 inches Weight:      136.75 pounds BMI:     25.10 Temp:     98.3 degrees F oral Pulse rate:   73 / minute Pulse rhythm:   regular BP sitting:   160 / 70  (left arm) Cuff size:   regular  Vitals Entered By: Benny Lennert CMA Duncan Dull) (April 01, 2010 1:13 PM)  History of Present Illness: Chief complaint Hospital follow up [facility])  Hospitalized with weakness Seen for bad joint pain as well Dr Gavin Potters injected her right knee---took fluid out. Cultures were negative Had not had too much pain with it  Husband had been in ER twice while she was there then he died  She was sent home since no clear diagnosis and needed to deal with details she has her family around so she is doing okay with this He had suffered for some time  Lives in independent living at [place] so gets one meal a day has some support if having more trouble has some help from [facility] for 24 hours since going home starting PT now from hospital  Allergies: 1)  Motrin (Ibuprofen)  Past History:  Past medical, surgical, family and social histories (including risk factors) reviewed for relevance to current acute and chronic problems.  Past Medical History: Reviewed history from 09/06/2008 and no changes required. Allergic rhinitis Atrial fibrillation--------------------------------------Dr Amil Amen GERD--(past web/stricture) Hypertension Osteopenia Psoriasis----------------------------------------------Dr Purcell Nails Osteoarthritis Renal insufficiency  Past Surgical History: Reviewed history from 06/10/2006 and no changes required. Cholecystectomy 2002 Hysterectomy 1978 Bradycardia- pacer 2004 Appendicitis 1938 Vaginal deliveries x 4 Cataracts 2000, 2001 ?Pneumonia/ tachycardia A-Fib 2003 Esophageal dilation ? 1990's Cardiolite negative 05/03 Mild LVH- ECHO 05/03 DEXA- bone density-  osteopenia (-1.2) 06/05  Family History: Reviewed history from 06/10/2006 and no changes required. Father: Died at age 108, MI, kidney cancer Mother: Alive CHF Siblings: One brother, kidney stones, CABG, HTN CAD in Dad, brother  and maybe Mom No breast or colon cancer  Social History: Reviewed history from 01/02/2008 and no changes required. Widowed 2/12 Children: 4 Never Smoked Alcohol use-no  Review of Systems       A little light headed now (but hasn't eaten in some time) Having trouble with her eyes weight is up a few pounds eating okay--children are keeping close tabs on her  Physical Exam  General:  alert and normal appearance.   Neck:  supple, no masses, no thyromegaly, and no cervical lymphadenopathy.   Lungs:  normal respiratory effort, no intercostal retractions, no accessory muscle use, and normal breath sounds.   Heart:  normal rate, regular rhythm, no murmur, and no gallop.   Abdomen:  soft and non-tender.   Extremities:  no edema Psych:  normally interactive and good eye contact.     Impression & Recommendations:  Problem # 1:  WEAKNESS (ICD-780.79) Assessment New reviewed hospital records no clear diagnosis Now with ongoing stress with husband's death (though with his suffering, it is a bit of a relief) Having Physical therapy to start now  no changes  Problem # 2:  HYPERTENSION (ICD-401.9) Assessment: Unchanged  reasonable with the current meds  Her updated medication list for this problem includes:    Lisinopril 20 Mg Tabs (Lisinopril) .Marland Kitchen... Take one tablet by mouth daily    Norvasc 10 Mg Tabs (Amlodipine besylate) .Marland Kitchen... Take one tablet daily  BP today: 160/70 Prior BP: 187/78 (03/17/2010)  Labs Reviewed: K+: 3.9 (03/06/2010) Creat: : 1.5 (03/06/2010)     Orders: TLB-Renal Function Panel (80069-RENAL) Venipuncture (08657)  Complete Medication List: 1)  Mobic 7.5 Mg Tabs (Meloxicam) .... Take 1 by mouth once daily 2)  Lisinopril 20 Mg  Tabs (Lisinopril) .... Take one tablet by mouth daily 3)  Amiodarone Hcl 200 Mg Tabs (Amiodarone hcl) .... Take 1 by mouth daily 4)  Protonix 40 Mg Tbec (Pantoprazole sodium) .... Take one by mouth daily 5)  Tramadol Hcl 50 Mg Tabs (Tramadol hcl) .... 1/2 - 1 tab by mouth three times a day as needed for pain 6)  Triamcinolone Acetonide 0.1 % Crea (Triamcinolone acetonide) .... Apply to affected areas two times a day until clear 7)  Methotrexate 2.5 Mg Tabs (Methotrexate sodium) .... Take 2 by mouth once a week 8)  Folic Acid 1 Mg Tabs (Folic acid) .... Take 1 by mouth once daily except on days when taking methotrexate 9)  Vitamin D 1000 Unit Tabs (Cholecalciferol) .... Once daily 10)  [facility]vs Daytime Sinus Relief 5-325 Mg Caps (Phenylephrine-acetaminophen) .... As needed 11)  Meclizine Hcl 25 Mg Tabs (Meclizine hcl) .Marland Kitchen.. 1 tablet three times a day as needed 12)  Norvasc 10 Mg Tabs (Amlodipine besylate) .... Take one tablet daily 13)  Lidoderm 5 % Ptch (Lidocaine) .... Apple one rash daily  Patient Instructions: 1)  Please schedule a follow-up appointment in 4-6 weeks.    Orders Added: 1)  Est. Patient Level IV [84696] 2)  TLB-Renal Function Panel [80069-RENAL] 3)  Venipuncture [29528]    Current Allergies (reviewed today): MOTRIN (IBUPROFEN)

## 2010-04-11 ENCOUNTER — Telehealth: Payer: Self-pay | Admitting: Internal Medicine

## 2010-04-14 ENCOUNTER — Encounter: Payer: Self-pay | Admitting: Internal Medicine

## 2010-04-15 ENCOUNTER — Telehealth: Payer: Self-pay | Admitting: Internal Medicine

## 2010-04-15 NOTE — Miscellaneous (Signed)
Summary: Face to Face Encounter/[facility]  Face to Face Encounter/[facility]   Imported By: Lanelle Bal 04/07/2010 13:02:26  _____________________________________________________________________  External Attachment:    Type:   Image     Comment:   External Document

## 2010-04-16 ENCOUNTER — Ambulatory Visit (INDEPENDENT_AMBULATORY_CARE_PROVIDER_SITE_OTHER): Payer: Medicare Other | Admitting: Internal Medicine

## 2010-04-16 ENCOUNTER — Encounter: Payer: Self-pay | Admitting: Internal Medicine

## 2010-04-16 DIAGNOSIS — M62838 Other muscle spasm: Secondary | ICD-10-CM | POA: Insufficient documentation

## 2010-04-16 DIAGNOSIS — J069 Acute upper respiratory infection, unspecified: Secondary | ICD-10-CM

## 2010-04-21 DIAGNOSIS — R262 Difficulty in walking, not elsewhere classified: Secondary | ICD-10-CM

## 2010-04-21 DIAGNOSIS — R5383 Other fatigue: Secondary | ICD-10-CM

## 2010-04-21 DIAGNOSIS — IMO0001 Reserved for inherently not codable concepts without codable children: Secondary | ICD-10-CM

## 2010-04-21 DIAGNOSIS — N390 Urinary tract infection, site not specified: Secondary | ICD-10-CM

## 2010-04-21 DIAGNOSIS — M069 Rheumatoid arthritis, unspecified: Secondary | ICD-10-CM

## 2010-04-21 DIAGNOSIS — R5381 Other malaise: Secondary | ICD-10-CM

## 2010-04-22 NOTE — Progress Notes (Signed)
Summary: needs verbal order   Phone Note Call from Patient Call back at [phone]   Caller: Jeff-PT w/ [facility]  Call For: Cindee Salt MD Summary of Call: Trey Paula is asking if he could get a verbal order to use ice therapy on patient's back, also would like to do soft tissue and joint mobilization therapy.  Initial call taken by: Melody Comas,  April 15, 2010 2:55 PM  Follow-up for Phone Call        Please let him know that this sounds fine Follow-up by: Cindee Salt MD,  April 16, 2010 9:06 AM  Additional Follow-up for Phone Call Additional follow up Details #1::        spoke with Jeff-PT w/ [facility]  and advised results. Additional Follow-up by: Mervin Hack CMA Duncan Dull),  April 16, 2010 11:39 AM

## 2010-04-22 NOTE — Progress Notes (Signed)
Summary: needs alternative to voltaren gel  Phone Note From Pharmacy Message from:  Pharmacy  Caller: [phone] Summary of Call: Pt had requested refill on voltaren gel, which is no longer on her med list, but this is unavailable from the manufacturer- pharmacist is not sure why, and they dont know when it will be available again.  They are asking if you want to change it to something else.  They told pt they would let her know on monday. Initial call taken by: Lowella Petties CMA, AAMA,  April 11, 2010 3:34 PM  Follow-up for Phone Call        can use lidoderm patch  check with pharmacist to see if there is any other topical NSAID also Follow-up by: Cindee Salt MD,  April 12, 2010 9:08 AM  Additional Follow-up for Phone Call Additional follow up Details #1::        spoke with pharmacist and the only thing avail is Pennsed? he said it's very expensive it another brand of Diclofenac. Please advise. DeShannon Smith CMA Duncan Dull)  April 14, 2010 3:23 PM   Have her use the lidoderm patch--up to 12 hours per day on the painful area refill #30 x 5 if she wants to use this Cindee Salt MD  April 14, 2010 3:36 PM   Spoke with patient and advised results. Pt wanted the patch, send rx to pharmacy. Additional Follow-up by: Mervin Hack CMA Duncan Dull),  April 15, 2010 12:24 PM    New/Updated Medications: LIDODERM 5 % PTCH (LIDOCAINE) use up to 12 hours per day on the painful area Prescriptions: LIDODERM 5 % PTCH (LIDOCAINE) use up to 12 hours per day on the painful area  #30 x 5   Entered by:   Mervin Hack CMA (AAMA)   Authorized by:   Cindee Salt MD   Signed by:   Mervin Hack CMA (AAMA) on 04/15/2010   Method used:   Electronically to        [facility]* (retail)       [address]       [place], [place]  [address]       Ph: [phone]       Fax: [phone]   RxID:   [id number]

## 2010-04-22 NOTE — Assessment & Plan Note (Signed)
Summary: acu visit per lori jrt   Vital Signs:  Patient profile:   77 year old female Weight:      135 pounds Temp:     98.3 degrees F oral Pulse rate:   66 / minute Pulse rhythm:   regular BP sitting:   112 / 60  (left arm) Cuff size:   regular  Vitals Entered By: Mervin Hack CMA Duncan Dull) (April 16, 2010 12:38 PM) CC: head and neck pain   History of Present Illness: Felt like she was getting a cold 4 days ago stayed in bed the next day Has stiffness in neck---can't rotate it Also noted pain into her lower back and down her left leg  Voice is off some maxillary and temporal pain no cough of note Some mild DOE No fever  Did try OTC analgesic Not clearly helpful  Did have follow up with Dr Gavin Potters before this got bad ??thinks she needs more MTX  Allergies: 1)  Motrin (Ibuprofen)  Past History:  Past medical, surgical, family and social histories (including risk factors) reviewed for relevance to current acute and chronic problems.  Past Medical History: Reviewed history from 09/06/2008 and no changes required. Allergic rhinitis Atrial fibrillation--------------------------------------Dr Amil Amen GERD--(past web/stricture) Hypertension Osteopenia Psoriasis----------------------------------------------Dr Purcell Nails Osteoarthritis Renal insufficiency  Past Surgical History: Reviewed history from 06/10/2006 and no changes required. Cholecystectomy 2002 Hysterectomy 1978 Bradycardia- pacer 2004 Appendicitis 1938 Vaginal deliveries x 4 Cataracts 2000, 2001 ?Pneumonia/ tachycardia A-Fib 2003 Esophageal dilation ? 1990's Cardiolite negative 05/03 Mild LVH- ECHO 05/03 DEXA- bone density- osteopenia (-1.2) 06/05  Family History: Reviewed history from 06/10/2006 and no changes required. Father: Died at age 44, MI, kidney cancer Mother: Alive CHF Siblings: One brother, kidney stones, CABG, HTN CAD in Dad, brother  and maybe Mom No breast or colon  cancer  Social History: Reviewed history from 04/01/2010 and no changes required. Widowed 2/12 Children: 4 Never Smoked Alcohol use-no  Review of Systems       No vomiting some loose stools yesterday appetite off but still able to eat  Physical Exam  General:  alert.  NAD Head:  mild maxillary tenderness Ears:  cerumen on right left is normal Mouth:  no erythema and no exudates.   Neck:  no masses.  Very stiff--spasm along entire posterior surface very limited ROM Lungs:  normal respiratory effort, no intercostal retractions, no accessory muscle use, and normal breath sounds.     Impression & Recommendations:  Problem # 1:  URI (ICD-465.9) Assessment New seems to be just viral discussed symptom relief  Problem # 2:  MUSCLE SPASM (ICD-728.85) Assessment: New may be secondary to the URI discussed heat---PT just put ice on it yesterday okay to continue acetominophen  Complete Medication List: 1)  Meclizine Hcl 25 Mg Tabs (Meclizine hcl) .Marland Kitchen.. 1 tablet three times a day as needed 2)  Norvasc 10 Mg Tabs (Amlodipine besylate) .... Take one tablet daily 3)  Lidoderm 5 % Ptch (Lidocaine) .... Use up to 12 hours per day on the painful area 4)  Mobic 7.5 Mg Tabs (Meloxicam) .... Take 1 by mouth once daily 5)  Lisinopril 20 Mg Tabs (Lisinopril) .... Take one tablet by mouth daily 6)  Amiodarone Hcl 200 Mg Tabs (Amiodarone hcl) .... Take 1 by mouth daily 7)  Protonix 40 Mg Tbec (Pantoprazole sodium) .... Take one by mouth daily 8)  Tramadol Hcl 50 Mg Tabs (Tramadol hcl) .... 1/2 - 1 tab by mouth three times a day as needed for  pain 9)  Triamcinolone Acetonide 0.1 % Crea (Triamcinolone acetonide) .... Apply to affected areas two times a day until clear 10)  Methotrexate 2.5 Mg Tabs (Methotrexate sodium) .... Take 2 by mouth once a week 11)  Folic Acid 1 Mg Tabs (Folic acid) .... Take 1 by mouth once daily except on days when taking methotrexate 12)  Vitamin D 1000 Unit Tabs  (Cholecalciferol) .... Once daily 13)  [facility]vs Daytime Sinus Relief 5-325 Mg Caps (Phenylephrine-acetaminophen) .... As needed  Patient Instructions: 1)  Please keep the appt on April 25th 2)  Please try warm compresses on your neck to help loosen up the stiffness 3)  Please continue the acetominophen 650mg  three times a day till your neck feels better   Orders Added: 1)  Est. Patient Level IV [16109]    Current Allergies (reviewed today): MOTRIN (IBUPROFEN)

## 2010-04-22 NOTE — Letter (Signed)
Summary: Missed Appointment note  Missed Appointment note   Imported By: Kassie Mends 04/16/2010 08:16:42  _____________________________________________________________________  External Attachment:    Type:   Image     Comment:   External Document

## 2010-04-22 NOTE — Letter (Signed)
Summary: [facility]  [facility]   Imported By: Maryln Gottron 04/18/2010 14:44:55  _____________________________________________________________________  External Attachment:    Type:   Image     Comment:   External Document  Appended Document: [facility] psoriatic arthritis Plans to increase methotrexate but he didn't prescribe it  Please call Dr [place] office Let him know that Dr Roseanne Kaufman must be prescribing the methotrexate, because I haven't been  Appended Document: [facility] I left the message for Dr[facility].

## 2010-04-24 ENCOUNTER — Other Ambulatory Visit: Payer: Self-pay | Admitting: Emergency Medicine

## 2010-04-24 MED ORDER — AMIODARONE HCL 200 MG PO TABS: 200.0000 mg | ORAL_TABLET | Freq: Every day | ORAL | Status: DC

## 2010-04-24 NOTE — Telephone Encounter (Signed)
rx sent into pharmacy/sab 

## 2010-04-24 NOTE — Telephone Encounter (Signed)
Error on prior rx. Was not sent to pharmacy.[facility]

## 2010-05-23 ENCOUNTER — Other Ambulatory Visit: Payer: Self-pay | Admitting: *Deleted

## 2010-05-23 ENCOUNTER — Other Ambulatory Visit: Payer: Self-pay | Admitting: Emergency Medicine

## 2010-05-23 MED ORDER — PANTOPRAZOLE SODIUM 40 MG PO TBEC: 40.0000 mg | DELAYED_RELEASE_TABLET | Freq: Every day | ORAL | Status: DC

## 2010-05-23 MED ORDER — AMLODIPINE BESYLATE 10 MG PO TABS: 10.0000 mg | ORAL_TABLET | Freq: Every day | ORAL | Status: DC

## 2010-05-28 ENCOUNTER — Encounter: Payer: Self-pay | Admitting: Internal Medicine

## 2010-05-28 ENCOUNTER — Ambulatory Visit (INDEPENDENT_AMBULATORY_CARE_PROVIDER_SITE_OTHER): Payer: Medicare Other | Admitting: Internal Medicine

## 2010-05-28 VITALS — BP 100/60 | HR 66 | Temp 97.6°F | Ht 62.0 in | Wt 134.0 lb

## 2010-05-28 DIAGNOSIS — M5137 Other intervertebral disc degeneration, lumbosacral region: Secondary | ICD-10-CM

## 2010-05-28 DIAGNOSIS — I10 Essential (primary) hypertension: Secondary | ICD-10-CM

## 2010-05-28 DIAGNOSIS — I4891 Unspecified atrial fibrillation: Secondary | ICD-10-CM

## 2010-05-28 DIAGNOSIS — L408 Other psoriasis: Secondary | ICD-10-CM

## 2010-05-28 DIAGNOSIS — M51379 Other intervertebral disc degeneration, lumbosacral region without mention of lumbar back pain or lower extremity pain: Secondary | ICD-10-CM

## 2010-05-28 NOTE — Progress Notes (Signed)
Subjective:    Patient ID: Car[facility]endez, female    DOB: April 11, 1923, 77 y.o.   MRN: [id number]  HPI Reviewed meds Takes the lasix about every other day---swelling is better now Did have it worse last week. Better with elevation  Done with PT Joint pain is better but still moves around very slowly Back pain is some better--still uses lidoderm Only uses tramadol rarely  Still in independent living at [facility] Has lull time after breakfast Children have arranged for caregivers at night--afraid she will fall Feels she is doing okay dealing with husband's recent death  No trouble with heart Has cardiology follow up planned No palpitations Rare very brief chest pains---no more than several seconds Breathing is okay except if affected by pollen  Current outpatient prescriptions:amiodarone (PACERONE) 200 MG tablet, Take 1 tablet (200 mg total) by mouth daily., Disp: 30 tablet, Rfl: 6;  folic acid (FOLVITE) 1 MG tablet, Take one tablet by mouth once daily except on days when taking Methotrexate , Disp: , Rfl: ;  furosemide (LASIX) 20 MG tablet, Take 1 tablet by mouth once daily as needed, Disp: , Rfl:  lidocaine (LIDODERM) 5 %, Place 1 patch onto the skin daily. Remove & Discard patch within 12 hours or as directed by MD , Disp: , Rfl: ;  lisinopril (PRINIVIL,ZESTRIL) 20 MG tablet, Take 20 mg by mouth daily.  , Disp: , Rfl: ;  meclizine (ANTIVERT) 25 MG tablet, Take 25 mg by mouth 3 (three) times daily as needed.  , Disp: , Rfl:  methotrexate (RHEUMATREX) 2.5 MG tablet, Take 4 tablets by mouth on Monday and 1 tablet Tues-Sunday, then next week take 3 tablets by mouth and 1 tablet Tues-Sunday, alternating., Disp: , Rfl: ;  pantoprazole (PROTONIX) 40 MG tablet, Take 1 tablet (40 mg total) by mouth daily., Disp: 30 tablet, Rfl: 11;  Phenylephrine-Acetaminophen ([facility] DAYTIME SINUS RELIEF) 5-325 MG CAPS, Take 1 capsule by mouth as needed.  , Disp: , Rfl:  traMADol (ULTRAM) 50 MG tablet, 1/2 to 1 tablet  by mouth three times a day as needed for pain , Disp: , Rfl: ;  DISCONTD: amLODipine (NORVASC) 10 MG tablet, Take 1 tablet (10 mg total) by mouth daily., Disp: 30 tablet, Rfl: 6;  DISCONTD: Cholecalciferol (VITAMIN D) 1000 UNITS capsule, Take 1,000 Units by mouth daily.  , Disp: , Rfl: ;  DISCONTD: meloxicam (MOBIC) 7.5 MG tablet, Take 7.5 mg by mouth daily.  , Disp: , Rfl:  DISCONTD: triamcinolone (KENALOG) 0.1 % cream, Apply 1 application topically 2 (two) times daily.  , Disp: , Rfl:   Past Medical History  Diagnosis Date  . Allergy   . Atrial fibrillation   . GERD (gastroesophageal reflux disease)   . Hypertension   . Osteoporosis     osteopenia, osteoarthritis  . Renal insufficiency     Past Surgical History  Procedure Date  . Cholecystectomy   . Abdominal hysterectomy   . Appendicitis   . Esophageal dilation     Family History  Problem Relation Age of Onset  . Heart disease Mother   . Cancer Father     kidney  . Heart disease Father   . Hypertension Brother   . Heart disease Brother     History   Social History  . Marital Status: Married    Spouse Name: N/A    Number of Children: 4  . Years of Education: N/A   Occupational History  .     Social History  Main Topics  . Smoking status: Never Smoker   . Smokeless tobacco: Not on file  . Alcohol Use: No  . Drug Use:   . Sexually Active:    Other Topics Concern  . Not on file   Social History Narrative  . No narrative on file   Review of Systems Sleeps fine and naps a good bit Appetite is fair Weight down a couple of pounds    Objective:   Physical Exam  Constitutional: She appears well-developed and well-nourished. No distress.  Neck: Normal range of motion. No thyromegaly present.  Cardiovascular: Normal rate, regular rhythm and normal heart sounds.  Exam reveals no gallop.   No murmur heard. Pulmonary/Chest: Effort normal and breath sounds normal. No respiratory distress. She has no wheezes. She  has no rales.  Abdominal: Soft. There is no tenderness.  Musculoskeletal: Normal range of motion. She exhibits no edema and no tenderness.  Lymphadenopathy:    She has no cervical adenopathy.  Psychiatric: Her behavior is normal. Thought content normal.       No depressed but sedate with mild psychomotor retardation          Assessment & Plan:

## 2010-06-19 ENCOUNTER — Other Ambulatory Visit: Payer: Self-pay | Admitting: *Deleted

## 2010-06-19 MED ORDER — TRAMADOL HCL 50 MG PO TABS: 50.0000 mg | ORAL_TABLET | Freq: Three times a day (TID) | ORAL | Status: DC | PRN

## 2010-06-19 NOTE — Telephone Encounter (Signed)
Fax is on your desk . 

## 2010-06-20 NOTE — Op Note (Signed)
Carla Mendez, Carla Mendez                        ACCOUNT NO.:  [id number]   MEDICAL RECORD NO.:  [id number]                   PATIENT TYPE:  INP   LOCATION:  2007                                 FACILITY:  [facility]   PHYSICIAN:  Francisca December, M.D.               DATE OF BIRTH:  [birth year]   DATE OF PROCEDURE:  02/14/2002  DATE OF DISCHARGE:                                 OPERATIVE REPORT   PROCEDURES PERFORMED:  1. Insertion dual chamber permanent transvenous pacemaker.  2. Left subclavian venogram.   INDICATIONS:  The patient is a 77 year old woman who has developed  symptomatic sinus bradycardia. She has not responded to the discontinuation  of amiodarone and digoxin for 1 week. She is brought not to the cardiac  catheterization laboratory for insertion of a dual chamber permanent  transvenous pacemaker.   DESCRIPTION OF PROCEDURE:  The patient was brought to the cardiac  catheterization laboratory in a post absorptive state. The left prepectoral  region was prepped and draped in the usual sterile fashion. Local anesthesia  was obtained with the infiltration of 1% Lidocaine with epinephrine  throughout the left prepectoral region.   A 6 to 7 cm incision was made in the deltopectoral groove and this was  carried down by sharp dissection and electrocautery to the prepectoral  fascia. There a plane was lifted and a pocket formed inferiorly and  medially.   The patient then underwent a left subclavian venogram with the peripheral  injection of 20 cc of Omnipaque. A digital cine angiogram was obtained and  subsequently roadmapped to guide future left subclavian puncture. The  venogram did confirm the vein to be widely patent and coursing in a normal  fashion over the anterior surface of the first rib and beneath the middle  third of the clavicle. There was no evidence for persistence of the left  superior vena cava.   Two separate left subclavian punctures were then performed  using an 18 gauge  thin walled needle, through which was passed a 0.038 inch tight J-guide  wire. Over the initial guide wire a 7 French tear-away sheath and dilator  were advanced. The dilator and wire were removed. The ventricular lead was  advanced to the level of the right atrium. The sheath was then torn away.   Using standard technique and fluoroscopic landmarks, the lead was  manipulated into the right ventricular apex. There, excellent pacing  parameters were obtained as will be noted below. The lead was tested for  diaphragmatic pacing at 10 volts and none was found. The lead was then  sutured into place using 3 separate 0 silk ligatures.   Over the remaining guide wire an 8 [place] tear-away sheath and dilator was  advanced. The dilator was removed. The wire was allowed to remain in place.  The atrial lead was advanced to the level of the right atrium.  The sheath  was  then torn away.   Using standard technique      and fluoroscopic landmarks, the lead was  manipulated into the right atrial appendage. There adequate pacing  parameters were obtained as will be noted  below. The lead was tested for  diaphragmatic pacing at 10 volts and none was found. The lead was an active  fixation device, and the screw was advanced.   The remaining guide wire was removed. The lead was sutured into place using  3 separate 0 silk ligatures. The pocket was the copiously irrigated using 1%  Kanamycin solution. The pocket was inspected for bleeding and none was  found.   The pacing generator was then attached to the pacing leads and each was  identified carefully by its serial number and inserted into the appropriate  receptacle. Each lead was tightened into place and tested for security. The  leads were then wound beneath the pacing generator and the generator was  placed in the pocket.   The wound was closed using 2-0 Dexon in a running fashion for the  subcutaneous layer. The skin was  approximated using 5-0 Dexon in a running  subcuticular fashion. Steri-Strips and a sterile dressing were applied. The  patient was transported to the recovery area in stable condition in an A  pace, V sense mode.   EQUIPMENT DATA:  1. The pacing generator is a Medtronic Kappa model [id number], serial     [id number].  2. The atrial lead is a Medtronic model [id number], serial [id number].  3. The ventricular lead is a Medtronic model [id number], serial [id number]   PACING DATA:  The ventricular lead detected an 8.8 millivolt R-wave. The  pacing threshold was 0.4 volts at 0.5 msec pulse width. The resistance was  683 ohms and the current was 1.0 MA.  The atrial lead detected a 4.7  millivolt RP wave. The pacing threshold was 1.6 volts at 0.5 msec. The  resistance was 725 ohms and the current was 2.8 MA.                                               Francisca December, M.D.    JHE/MEDQ  D:  02/14/2002  T:  02/14/2002  Job:  [id number]   cc:   Lilla Shook, M.D.  [address]  [place]  [place]  [address]  Fax: [phone]   Cardiac Catheterization Laboratory

## 2010-06-20 NOTE — Discharge Summary (Signed)
Carla Mendez, Carla Mendez                        ACCOUNT NO.:  [id number]   MEDICAL RECORD NO.:  [id number]                   PATIENT TYPE:  INP   LOCATION:  2007                                 FACILITY:  [facility]   PHYSICIAN:  Lilla Shook, M.D.            DATE OF BIRTH:  04-Dec-1923   DATE OF ADMISSION:  02/07/2002  DATE OF DISCHARGE:  02/15/2002                                 DISCHARGE SUMMARY   DISCHARGE DIAGNOSES:  1. Symptomatic bradycardia.  2. Status post dual chamber pacemaker implant.  3. Mild pulmonary hypertension likely secondary to increased left     ventricular end-diastolic pressure from a marked bradycardia.  4. Systolic hypertension.  5. History of atrial fibrillation on Pacerone and Lanoxin therapies which     probably caused number one.  6. Hyperlipidemia.  7. Gastroesophageal reflux disease.   DISCHARGE MEDICATIONS:  1. Furosemide 20 mg daily.  2. Protonix 40 mg daily.  3. Premarin 0.625 mg daily.  4. Zyrtec 10 mg daily.  5. Citracal daily.  6. Actonel 35 mg daily.  7. Clarinex p.r.n.  8. Pacerone 200 mg one-half tablet daily.  9. Lanoxin was discontinued.   HISTORY OF PRESENT ILLNESS:  The patient is a 77 year old woman with a past  medical history of atrial fibrillation on Lanoxin and Pacerone.  She has  complaints of fatigue and dyspnea for the past couple of weeks and had been  lightheaded with standing.  Was easily tired with small activities.  Denies  chest pain, but slightly short of breath.  An EKG done in the office showed  sinus bradycardia with a rate in the 30s-40s.   SIGNIFICANT DATA:  She is without discharge, but appeared tired.  Blood  pressure 130/68 with a heart rate of 48.  EKG with sinus bradycardia.  Laboratory values were all normal.   HOSPITAL COURSE:  Problem 1 - SYMPTOMATIC BRADYCARDIA AND SICK SINUS  SYNDROME:  Initially, she was taken off the Pacerone and digoxin therapies.  Telemetry showed a continued bradycardia,  actually sometime into the 20s  when she was asleep.  She was asymptomatic with this.  It was actually  thought that her symptoms were likely due to prolonged effect of the digoxin  and sick sinus syndrome and we awaited the effects of the medication wearing  off.  Francisca December, M.D. was consulted as he decided he did end up  putting a dual chamber pacer in and she did well with this.  The patient had  had some early pulmonary edema by chest x-ray and continued to receive her  furosemide.  Intravenous fluids were discontinued.  She had an  echocardiogram that showed an EF of 65% with aortic valve sclerosis, RV/PA  systolic pressure about 45 mmHg which is mildly elevated.  Likely secondary  to elevated left ventricular pressure from the marked bradycardia.  After  the pacemaker was placed she was able to ambulate  the halls without  problems.   Problem 2 - SYSTOLIC HYPERTENSION:  This seemed to worsen a bit after being  off her furosemide initially and after it was replaced this was much better.  Also, better after placement of the pacemaker.   CONSULTATIONS:  Francisca December, M.D. of cardiology.   CONDITION ON DISCHARGE:  Stable and much improved.   DISCHARGE INSTRUCTIONS:  She was to continue her home medications except to  remain on one-half tablet of Pacerone 200 mg.  Digoxin was discontinued all  together and she was to remain off this.  The patient was to follow a low  fat, low salt, and low cholesterol diet as well as call with any problems or  questions.  She was to follow up with me in two weeks after discharge and  also follow up with Francisca December, M.D. within the same amount of time.  She was given instructions on __________ activities, allowing such.                                               Lilla Shook, M.D.    SEJ/MEDQ  D:  04/20/2002  T:  04/20/2002  Job:  [id number]

## 2010-06-20 NOTE — Consult Note (Signed)
[name], Carla Mendez                        ACCOUNT NO.:  [id number]   MEDICAL RECORD NO.:  [id number]                   PATIENT TYPE:  INP   LOCATION:  9811                                 FACILITY:  [facility]   PHYSICIAN:  Francisca December, M.D.               DATE OF BIRTH:  [birth year]   DATE OF CONSULTATION:  02/08/2002  DATE OF DISCHARGE:  02/13/2002                                   CONSULTATION   REASON FOR CONSULTATION:  Symptomatic bradycardia.   IMPRESSION:  1. Symptomatic sinus bradycardia in the setting of low dose amiodarone and     digoxin.  2. History of atrial fibrillation.  3. Controlled amiodarone.  4. Labile systolic hypertension.  5. History of pneumonia, February 2003.  6. History of congestive heart failure in the setting of atrial fibrillation     with rapid ventricular response February 2003.   RECOMMENDATIONS:  Discontinue digoxin and amiodarone.  Hopefully this will  allow for return of appropriate sinus rhythm.  If not, the patient may need  permanent transvenous pacemaker.  Another option is to observe long-term off  of amiodarone (two to four weeks) and pursue pulmonary pacemaker at that  time only if sinus rhythm has not returned to normal.  Unfortunately, the  above will increase the likelihood of recurrent atrial fibrillation and  require possible systemic anticoagulation.   HISTORY OF PRESENT ILLNESS:  The patient is a pleasant 77 year old woman  well known to me since presenting to my office for evaluation and treatment  of atrial fibrillation in February of 2003.  This was in the setting of  pneumonia.  She was initially was treated with rate control and  anticoagulation.  Amiodarone was initiated and she had a return to sinus  rhythm.  She was also continued on digoxin at that time.  A 2-D  echocardiogram showed normal LV systolic function, mild left atrial  enlargement.  Subsequent Adenosine-Cardiolite showed no evidence of  ischemia.   On  the January 16, 2002, she noted the onset of lightheadedness treated  with Meclizine and this resolved.  On the 19th of December, she developed an  upper respiratory tract infection treated with sulfamethoxazole and Nasonex.  She presented to Dr. Lovell Sheehan office on February 08, 2002 complaining of mild  shortness of breath, marked fatigue, and lightheadedness.  She was  subsequently admitted to [facility] with marked sinus bradycardia  rate of 38 beats per minute.  Serial CK-MB and troponin enzymes were normal.  There was mild pulmonary vascular congestion seen on her chest x-ray.   PAST MEDICAL HISTORY:  As above.   SOCIAL HISTORY:  She has been married for 55 years.  She has four sons.  She  is a homemaker, retired having worked for the [employer] in the past.  She does not use any ethanol or tobacco problems.   FAMILY HISTORY:  Mother died at  age 23 of sudden death.  Father died at age  37 of unknown causes, possible kidney cancer.  This one brother has had a  bypass surgery about six months ago.   REVIEW OF SYMPTOMS:  She denies any problems with blurred or double vision.  She has  not had any dysphagia or difficulty with her teeth or gums.  She  has had no neck stiffness.  No history of goiter.  She denies productive  cough.  She has had some cough recently.  No hemoptysis.  She has not had  any angina or exertional dyspnea.  Denies any abdominal pain, diarrhea,  constipation, hematochezia, or melena.  No dysuria, hematuria, or nocturia.  No muscle weakness or joint pain.   PHYSICAL EXAMINATION:  VITAL SIGNS:  Blood pressure is 220/71, heart rate is  36 and regular, respiratory rate 16, temperature afebrile.  GENERAL:  This is a well-appearing 77 year old woman in no distress.  HEENT:  Unremarkable.  Head is normocephalic and atraumatic.  The pupils are  equal, round, and reactive to light and accomodation.  Extraocular movements  intact.  Sclerae are anicteric.  Oral  mucosa is pink and moist.  The tongue  is not coated.  NECK:  The neck is supple without thyromegaly or masses.  The carotid  upstrokes are normal.  There is no bruit and there is no jugular venous  distention.  CHEST:  Clear with adequate excursion bilaterally.  No wheezes, rales, or  rhonchi.  HEART:  Bradycardic rhythm.  Normal S1 and S2.  No S3, S4, murmur, click, or  rub noted.  ABDOMEN:  Soft and nontender without hepatosplenomegaly or midline pulsatile  mass.  Bowel sounds are present in all quadrants.  The external genitalia is  atrophic.  RECTAL:  Not performed.  EXTREMITIES:  Full range of motion, no edema and intact distal pulses.  NEUROLOGICAL:  Cranial nerves II through XII intact.  Motor and sensory are  grossly intact.  Gait not tested.  SKIN:  Warm, dry, and clear.   LABORATORY DATA:  Electrocardiogram shows sinus bradycardia, marked with  first degree AV block.  Normal QTC interval and nonspecific T-wave  abnormality.  Chest x-ray shows mild cardiomegaly with vascular congestion,  possibly early edema.   DISPOSITION:  I agree with your plan for intravenous furosemide one dose to  treat this mild degree of congestive failure.  This is likely due to her  marked bradycardia.  Hopefully, her heart rate will improve with  discontinuation of digoxin; however, if not, the permanent pacemaker would  be indicated even in the setting of amiodarone as this is an important drug  for treatment of her atrial fibrillation.   Thank you very much for allowing me to assist in the care of the patient.  It has been a pleasure to do so.  I will discuss her further care with you.                                                Francisca December, M.D.    JHE/MEDQ  D:  02/14/2002  T:  02/14/2002  Job:  [id number]   cc:   Lilla Shook, M.D.  [address], [address]  [place]  [place]  [address]  Fax: [phone]

## 2010-06-27 ENCOUNTER — Ambulatory Visit (INDEPENDENT_AMBULATORY_CARE_PROVIDER_SITE_OTHER): Payer: Medicare Other | Admitting: Family Medicine

## 2010-06-27 ENCOUNTER — Encounter: Payer: Self-pay | Admitting: Family Medicine

## 2010-06-27 DIAGNOSIS — IMO0002 Reserved for concepts with insufficient information to code with codable children: Secondary | ICD-10-CM

## 2010-06-27 DIAGNOSIS — T148XXA Other injury of unspecified body region, initial encounter: Secondary | ICD-10-CM

## 2010-06-27 NOTE — Patient Instructions (Addendum)
Keep the bandage on today.  Change it on Saturday and then again late Sunday or early Monday.  Put neosporin and a nonstick bandage on the spots and then cover it with gauze.  Come back for a recheck on Tuesday morning.  30 min visit.  Take care.

## 2010-06-27 NOTE — Progress Notes (Signed)
Carla Mendez and went to [facility] ER with skin tears.  ER notes reviewed.  No fx noted on films at [facility].   She had dream late at night and that led to the fall in her bedroom early Thursday AM.  911 called and then went to ER.   Hip pain is at baseline.  Sore 'all over' but no focal pain.   nad ncat Skin with tears noted: R forearm 9x1cm tear, 4x1.5 tear, 1x1cm tear.  This is measuring denuded skin, no old adherent skin that may be lost as the sites heal. R upper arm 10x6cm abrasion and skin prev trimmed away.   No spreading erythema, purulent discharge or foul odor.

## 2010-06-28 ENCOUNTER — Encounter: Payer: Self-pay | Admitting: Family Medicine

## 2010-06-28 NOTE — Assessment & Plan Note (Signed)
D/w pt.  Each wound inspected, covered with nonstick telfa and neosporin.  Wrapped with gauze and tolerated well.  Will change dressing twice over the weekend and then fu with me or PMD early Tuesday.  Okay for outpatient fu.  This is likely not going to heal quickly, but doesn't appear infected.  If fever, purulent discharge, then notify MD.  She agrees.

## 2010-07-01 ENCOUNTER — Encounter: Payer: Self-pay | Admitting: Family Medicine

## 2010-07-01 ENCOUNTER — Telehealth: Payer: Self-pay | Admitting: *Deleted

## 2010-07-01 ENCOUNTER — Ambulatory Visit: Payer: Medicare Other | Admitting: Family Medicine

## 2010-07-01 ENCOUNTER — Ambulatory Visit (INDEPENDENT_AMBULATORY_CARE_PROVIDER_SITE_OTHER): Payer: Medicare Other | Admitting: Family Medicine

## 2010-07-01 VITALS — BP 158/76 | HR 72 | Temp 98.2°F | Wt 136.2 lb

## 2010-07-01 DIAGNOSIS — T148XXA Other injury of unspecified body region, initial encounter: Secondary | ICD-10-CM

## 2010-07-01 DIAGNOSIS — IMO0002 Reserved for concepts with insufficient information to code with codable children: Secondary | ICD-10-CM

## 2010-07-01 NOTE — Telephone Encounter (Signed)
Left message on machine asking pt to return my call, pt was in to see Dr. Para March today for skin tears, Dr.Duncan asked Dr.Letvak if he wanted to re-check the skin tears and he would, need pt to come in on Thursday for re-check by Dr.Letvak.

## 2010-07-01 NOTE — Assessment & Plan Note (Addendum)
With some interval healing, doesn't appear infected.  Rebandaged and tolerated well.  She'll call back with update on Thursday, sooner as needed.  She agreed.  She has help with bandage changes at home.  Okay for outpatient fu.

## 2010-07-01 NOTE — Progress Notes (Signed)
Here for follow up. She took some tramadol about 1 hour ago for the discomfort/soreness.  This was the first time she took it recently.  No fevers.  Daily dressing changes at home.  No pus noted by patient in the wounds.  Meds, vitals, and allergies reviewed.   ROS: See HPI.  Otherwise, noncontributory.  nad R arm with skin tears noted.  Similar dimensions are before but no purulent discharge.  Wounds all appear to be healing slowly.  She tolerated the redressing with neosporin, telfa, and gauze.   Distally NV intact

## 2010-07-01 NOTE — Patient Instructions (Signed)
Keep changing the bandages daily and call me with an update on Thursday.  Let us know if you have a fever or notice pus draining from the wounds.  Take care.

## 2010-07-02 NOTE — Telephone Encounter (Signed)
Spoke with patient and she stated she doesn't need an appt now, she's in pain but feels ok. Per patient she will call if an appt is needed.

## 2010-07-02 NOTE — Telephone Encounter (Signed)
okay

## 2010-07-04 ENCOUNTER — Telehealth: Payer: Self-pay | Admitting: *Deleted

## 2010-07-04 NOTE — Telephone Encounter (Signed)
If she thinks it is healing okay---not sure we need to She can call on Monday morning if she doesn't think it is scabbing up well or there are concerns for infection

## 2010-07-04 NOTE — Telephone Encounter (Signed)
Spoke with patient and advised results   

## 2010-07-04 NOTE — Telephone Encounter (Signed)
Pt called with an update on her arm.  She says her arm is doing better but itching badly, she says that is a sign of healing.  She is asking if you want to see her for a follow up next week.

## 2010-08-02 ENCOUNTER — Telehealth: Payer: Self-pay | Admitting: *Deleted

## 2010-08-02 DIAGNOSIS — M1990 Unspecified osteoarthritis, unspecified site: Secondary | ICD-10-CM

## 2010-08-02 NOTE — Telephone Encounter (Signed)
Pt would like referral to ortho for hip pain. Prefers to go to [facility].  Notes from ER are on your desk.

## 2010-08-04 NOTE — Telephone Encounter (Signed)
Referral will be schedule at [facility].  cdavis 08-04-2010

## 2010-08-07 ENCOUNTER — Telehealth: Payer: Self-pay | Admitting: *Deleted

## 2010-08-07 NOTE — Telephone Encounter (Signed)
Pt saw ortho at [facility] this morning who advised her to take calcium and vitamin D and begin physical therapy for her hip.  Pt would like your opinion on who she should see and is asking that you call her.  She will be getting this at home.

## 2010-08-07 NOTE — Telephone Encounter (Signed)
I recommend [facility] here in [place]---across the street from the hospital

## 2010-08-08 NOTE — Telephone Encounter (Signed)
recommeded "In home PT" as she has trouble getting around Phone number given since she has Rx from Dr Dion Saucier already

## 2010-08-08 NOTE — Telephone Encounter (Signed)
Spoke with patient and she would like to know if we should make the appt for her physical therapy? After I kept talking with patient she want's physical therapy in the home. Patient also stated that she's been using suppositories and other things for constipation, she would like to know what can she do? Patient would like a personal phone call from [facility], if not patient states she will make an appt to talk with Dr.Letvak. Please advise

## 2010-08-11 ENCOUNTER — Other Ambulatory Visit: Payer: Self-pay | Admitting: *Deleted

## 2010-08-11 MED ORDER — LIDOCAINE 5 % EX PTCH: 1.0000 | MEDICATED_PATCH | CUTANEOUS | Status: DC

## 2010-08-11 NOTE — Telephone Encounter (Signed)
Rx sent electronically.  

## 2010-08-11 NOTE — Telephone Encounter (Signed)
Received faxed refill request from pharmacy.

## 2010-08-12 ENCOUNTER — Other Ambulatory Visit: Payer: Self-pay | Admitting: *Deleted

## 2010-08-12 NOTE — Telephone Encounter (Signed)
Received a faxed refill request from pharmacy for Voltaren gel 1% GM #100, apply as directed four times daily as needed for pain. This is not on patient's medication list. Called pharmacy about the Lidoderm patch that was refilled yesterday. Was informed by pharmacist  that patient does not want the patch, but wants the Voltaren gel instead.

## 2010-08-13 MED ORDER — DICLOFENAC SODIUM 1 % TD GEL: 1.0000 "application " | Freq: Four times a day (QID) | TRANSDERMAL | Status: DC | PRN

## 2010-08-13 NOTE — Telephone Encounter (Signed)
rx faxed to pharmacy manually Med list updated

## 2010-08-13 NOTE — Telephone Encounter (Signed)
Okay to change back to voltaren and fill for 1 year Please update med list

## 2010-08-21 ENCOUNTER — Encounter: Payer: Self-pay | Admitting: Internal Medicine

## 2010-08-21 ENCOUNTER — Ambulatory Visit (INDEPENDENT_AMBULATORY_CARE_PROVIDER_SITE_OTHER): Payer: Medicare Other | Admitting: Internal Medicine

## 2010-08-21 DIAGNOSIS — M5137 Other intervertebral disc degeneration, lumbosacral region: Secondary | ICD-10-CM

## 2010-08-21 DIAGNOSIS — I951 Orthostatic hypotension: Secondary | ICD-10-CM

## 2010-08-21 DIAGNOSIS — I4891 Unspecified atrial fibrillation: Secondary | ICD-10-CM

## 2010-08-21 DIAGNOSIS — I10 Essential (primary) hypertension: Secondary | ICD-10-CM

## 2010-08-21 NOTE — Assessment & Plan Note (Signed)
Good rate control Seems regular now on amiodarone

## 2010-08-21 NOTE — Assessment & Plan Note (Signed)
Ongoing pain but did get help from shots

## 2010-08-21 NOTE — Assessment & Plan Note (Signed)
30mm Hg drop with no compensatory rate increase Will cut the lisinopril in half----symptoms are still fairly mild

## 2010-08-21 NOTE — Progress Notes (Signed)
Subjective:    Patient ID: Carla Mendez, female    DOB: 1923/05/03, 77 y.o.   MRN: [id number]  HPI Recent eval with Dr Metta Clines  She told him about dizziness upon first sitting up or standing Needs to stand there for 2-3 minutes and then she feels back to normal Her aide wasn't aware of any orthostatic symptoms Usually is when first getting out of bed in the morning---less so later in day  Satisfied with pain relief with first set of shots from Dr Metta Clines  No chest pain Does get some palpitations with racing in AM---doesn't occur otherwise. Lasts a minute or so--seems to stop with movement No SOB  Has had some swelling in feet for 3 days  Current Outpatient Prescriptions on File Prior to Visit  Medication Sig Dispense Refill  . amiodarone (PACERONE) 200 MG tablet Take 1 tablet (200 mg total) by mouth daily.  30 tablet  6  . folic acid (FOLVITE) 1 MG tablet Take three tablet by mouth once daily except on days when taking Methotrexate, take one      . furosemide (LASIX) 20 MG tablet Take 1 tablet by mouth once daily as needed      . lidocaine (LIDODERM) 5 % Place 1 patch onto the skin daily. Remove & Discard patch within 12 hours or as directed by MD  30 patch  11  . lisinopril (PRINIVIL,ZESTRIL) 20 MG tablet Take 20 mg by mouth daily.        . meclizine (ANTIVERT) 25 MG tablet Take 25 mg by mouth 3 (three) times daily as needed.        . methotrexate (RHEUMATREX) 2.5 MG tablet Take 3 tablets by mouth on Monday and 1 tablet Tues-Sunday      . pantoprazole (PROTONIX) 40 MG tablet Take 1 tablet (40 mg total) by mouth daily.  30 tablet  11  . Phenylephrine-Acetaminophen ([employer] DAYTIME SINUS RELIEF) 5-325 MG CAPS Take 1 capsule by mouth as needed.        . traMADol (ULTRAM) 50 MG tablet Take 1 tablet (50 mg total) by mouth every 8 (eight) hours as needed for pain. 1/2 to 1 tablet by mouth three times a day as needed for pain  60 tablet  1    Allergies  Allergen Reactions  . Ibuprofen    REACTION: swelling  . Mobic Swelling    Past Medical History  Diagnosis Date  . Allergy   . Atrial fibrillation   . GERD (gastroesophageal reflux disease)   . Hypertension   . Osteoporosis     osteopenia, osteoarthritis  . Renal insufficiency     Past Surgical History  Procedure Date  . Cholecystectomy   . Abdominal hysterectomy   . Appendicitis   . Esophageal dilation     Family History  Problem Relation Age of Onset  . Heart disease Mother   . Cancer Father     kidney  . Heart disease Father   . Hypertension Brother   . Heart disease Brother     History   Social History  . Marital Status: Married    Spouse Name: N/A    Number of Children: 4  . Years of Education: N/A   Occupational History  .     Social History Main Topics  . Smoking status: Never Smoker   . Smokeless tobacco: Never Used  . Alcohol Use: No  . Drug Use: No  . Sexually Active: Not on file   Other Topics  Concern  . Not on file   Social History Narrative  . No narrative on file    Review of Systems Appetite is good Weight seems to be stable or down a little Sleeps very well   Objective:   Physical Exam  Constitutional: She appears well-developed and well-nourished. No distress.  Neck: Normal range of motion. Neck supple.  Cardiovascular: Normal rate and normal heart sounds.  Exam reveals no gallop.   No murmur heard.      Lying BP 154/78 with pulse 64 Standing BP 120/62 with pulse 64  Pulmonary/Chest: Effort normal. No respiratory distress. She has no wheezes. She has no rales.  Abdominal: Soft. There is no tenderness.  Musculoskeletal: She exhibits no edema.       Needs considerable help to get up step to table Marked slowing even lying back due to back problems  Lymphadenopathy:    She has no cervical adenopathy.  Psychiatric: Her behavior is normal. Thought content normal.          Assessment & Plan:

## 2010-08-21 NOTE — Assessment & Plan Note (Signed)
BP Readings from Last 3 Encounters:  08/21/10 128/68  07/01/10 158/76  06/27/10 110/72   Generally has been good Will cut the lisinopril dose due to orthostasis

## 2010-08-21 NOTE — Patient Instructions (Addendum)
Please cut the lisinopril in half and just take 10mg  daily Please cancel the August

## 2010-09-23 ENCOUNTER — Encounter: Payer: Self-pay | Admitting: Internal Medicine

## 2010-09-23 ENCOUNTER — Ambulatory Visit (INDEPENDENT_AMBULATORY_CARE_PROVIDER_SITE_OTHER): Payer: Medicare Other | Admitting: Internal Medicine

## 2010-09-23 VITALS — BP 122/70 | HR 65 | Temp 98.0°F | Ht 62.0 in | Wt 134.0 lb

## 2010-09-23 DIAGNOSIS — M5137 Other intervertebral disc degeneration, lumbosacral region: Secondary | ICD-10-CM

## 2010-09-23 NOTE — Progress Notes (Signed)
  Subjective:    Patient ID: Carla Mendez, female    DOB: 1923-04-08, 77 y.o.   MRN: [id number]  HPI Here to review caregiving needs for [facility] form Has 24 hour care for safety Needs supervision of meds Hands-on help with dressing and bathing Can use toilet on her own but needs help when she has periodic incontinence Needs total set up for meals but can feed herself   Review of Systems     Objective:   Physical Exam        Assessment & Plan:

## 2010-09-23 NOTE — Assessment & Plan Note (Signed)
Pain has been better with injections from Dr Metta Clines Still disabled and requires care Counselling and history for all of visit Form done

## 2010-10-01 ENCOUNTER — Ambulatory Visit: Payer: Medicare Other | Admitting: Internal Medicine

## 2010-10-02 ENCOUNTER — Encounter: Payer: Self-pay | Admitting: Internal Medicine

## 2010-10-08 ENCOUNTER — Encounter: Payer: Self-pay | Admitting: Internal Medicine

## 2010-10-08 ENCOUNTER — Telehealth: Payer: Self-pay

## 2010-10-08 ENCOUNTER — Ambulatory Visit (INDEPENDENT_AMBULATORY_CARE_PROVIDER_SITE_OTHER): Payer: Medicare Other | Admitting: Internal Medicine

## 2010-10-08 DIAGNOSIS — Z950 Presence of cardiac pacemaker: Secondary | ICD-10-CM

## 2010-10-08 DIAGNOSIS — I10 Essential (primary) hypertension: Secondary | ICD-10-CM

## 2010-10-08 DIAGNOSIS — I4891 Unspecified atrial fibrillation: Secondary | ICD-10-CM

## 2010-10-08 LAB — PACEMAKER DEVICE OBSERVATION
BATTERY VOLTAGE: 2.59 V
BMOD-0003RV: 30
BRDY-0002RV: 65 {beats}/min
RV LEAD AMPLITUDE: 15.68 mv
RV LEAD IMPEDENCE PM: 660 Ohm
RV LEAD THRESHOLD: 0.75 V

## 2010-10-08 MED ORDER — LISINOPRIL 20 MG PO TABS: 10.0000 mg | ORAL_TABLET | Freq: Every day | ORAL | Status: DC

## 2010-10-08 NOTE — Telephone Encounter (Signed)
Requested a refill for lisinopril 20 mg take one tablet daily.

## 2010-10-08 NOTE — Patient Instructions (Signed)
Your physician has recommended that you have a pacemaker generator change. Please see the instruction sheet given to you today for more information.    

## 2010-10-08 NOTE — Assessment & Plan Note (Signed)
She appears to be maintaining sinus rhythm. She will continue her current medical therapy. 

## 2010-10-08 NOTE — Assessment & Plan Note (Signed)
Her device has reached elective replacement and has reverted to the VVI pacing mode. I recommended pacemaker generator change. The risk, goals, benefits, and expectations of the procedure have been discussed with the patient and she wishes to proceed.

## 2010-10-08 NOTE — Assessment & Plan Note (Signed)
Her blood pressure is elevated today. However because of her history of orthostasis, I would allowed to run high to help reduce the risk of recurrent syncope.

## 2010-10-08 NOTE — Progress Notes (Signed)
HPI Carla Mendez further day for pacemaker evaluation. She has a history of severe bradycardia and paroxysmal A. Fib. She underwent pacemaker insertion back in 2004. She has continued to do well. She has been on low-dose amiodarone to help prevent atrial fibrillation. Over the last several weeks however she has had increasing fatigue weakness and peripheral edema. She is referred for elective replacement of her pacemaker and has gone from dual-chamber pacing to single-chamber pacing as part of a battery saving mode. She denies syncope, near syncope, or chest pain. Allergies  Allergen Reactions  . Ibuprofen     REACTION: swelling  . Mobic Swelling     Current Outpatient Prescriptions  Medication Sig Dispense Refill  . amiodarone (PACERONE) 200 MG tablet Take 1 tablet (200 mg total) by mouth daily.  30 tablet  6  . folic acid (FOLVITE) 1 MG tablet Take three tablet by mouth once daily except on days when taking Methotrexate, take one      . furosemide (LASIX) 20 MG tablet Take 1 tablet by mouth once daily as needed      . lidocaine (LIDODERM) 5 % Place 1 patch onto the skin daily. Remove & Discard patch within 12 hours or as directed by MD  30 patch  11  . lisinopril (PRINIVIL,ZESTRIL) 20 MG tablet Take 10 mg by mouth daily.       . meclizine (ANTIVERT) 25 MG tablet Take 25 mg by mouth 3 (three) times daily as needed.        . pantoprazole (PROTONIX) 40 MG tablet Take 1 tablet (40 mg total) by mouth daily.  30 tablet  11  . Phenylephrine-Acetaminophen ([employer] DAYTIME SINUS RELIEF) 5-325 MG CAPS Take 1 capsule by mouth as needed.        . traMADol (ULTRAM) 50 MG tablet Take 50-100 mg by mouth 3 (three) times daily as needed.        . triamcinolone (KENALOG) 0.1 % cream Apply 1 application topically 2 (two) times daily.           Past Medical History  Diagnosis Date  . Allergy   . Atrial fibrillation   . GERD (gastroesophageal reflux disease)   . Hypertension   . Osteoporosis     osteopenia,  osteoarthritis  . Renal insufficiency   . Allergic rhinitis   . Psoriasis     ROS:   All systems reviewed and negative except as noted in the HPI.   Past Surgical History  Procedure Date  . Cholecystectomy   . Abdominal hysterectomy   . Appendicitis   . Esophageal dilation      Family History  Problem Relation Age of Onset  . Heart disease Mother   . Cancer Father     kidney  . Heart disease Father   . Hypertension Brother   . Heart disease Brother   . Breast cancer Neg Hx   . Colon cancer Neg Hx      History   Social History  . Marital Status: Married    Spouse Name: N/A    Number of Children: 4  . Years of Education: N/A   Occupational History  .     Social History Main Topics  . Smoking status: Never Smoker   . Smokeless tobacco: Never Used  . Alcohol Use: No  . Drug Use: No  . Sexually Active: Not on file   Other Topics Concern  . Not on file   Social History Narrative  . No narrative  on file     BP 160/77  Pulse 64  Ht 5\' 3"  (1.6 m)  Wt 138 lb (62.596 kg)  BMI 24.45 kg/m2  Physical Exam:  Elderly appearing woman, NAD HEENT: Unremarkable Neck:  No JVD, no thyromegally Lymphatics:  No adenopathy Back:  No CVA tenderness Lungs:  Clear. Well healed pacemaker incision. HEART:  Regular rate rhythm, no murmurs, no rubs, no clicks Abd:  soft, positive bowel sounds, no organomegally, no rebound, no guarding Ext:  2 plus pulses, no edema, no cyanosis, no clubbing Skin:  No rashes no nodules Neuro:  CN II through XII intact, motor grossly intact  DEVICE  Normal device function.  See PaceArt for details. Device at elective replacement.  Assess/Plan:

## 2010-10-09 ENCOUNTER — Telehealth: Payer: Self-pay | Admitting: Internal Medicine

## 2010-10-09 LAB — BASIC METABOLIC PANEL
BUN: 32 mg/dL — ABNORMAL HIGH (ref 6–23)
CO2: 25 mEq/L (ref 19–32)
Chloride: 106 mEq/L (ref 96–112)
GFR: 33.53 mL/min — ABNORMAL LOW (ref >60.00)
Glucose, Bld: 92 mg/dL (ref 70–99)
Potassium: 4 mEq/L (ref 3.5–5.1)
Sodium: 139 mEq/L (ref 135–145)

## 2010-10-09 LAB — CBC WITH DIFFERENTIAL/PLATELET
Basophils Absolute: 0.1 10*3/uL (ref 0.0–0.1)
HCT: 36.5 % (ref 36.0–46.0)
Lymphs Abs: 2.6 10*3/uL (ref 0.7–4.0)
MCV: 96.6 fl (ref 78.0–100.0)
Monocytes Absolute: 0.4 10*3/uL (ref 0.1–1.0)
Platelets: 179 10*3/uL (ref 150.0–400.0)
RDW: 15.3 % — ABNORMAL HIGH (ref 11.5–14.6)

## 2010-10-09 NOTE — Telephone Encounter (Signed)
Pt's son calling re battery change next week

## 2010-10-09 NOTE — Telephone Encounter (Signed)
Spoke with son  He is aware of instructions  She is not on Coumadin

## 2010-10-16 ENCOUNTER — Ambulatory Visit (HOSPITAL_COMMUNITY): Payer: Medicare Other

## 2010-10-16 ENCOUNTER — Ambulatory Visit (HOSPITAL_COMMUNITY)
Admission: RE | Admit: 2010-10-16 | Discharge: 2010-10-16 | Disposition: A | Discharge status: Home / Self Care | Payer: Medicare Other | Source: Ambulatory Visit | Attending: Internal Medicine | Admitting: Internal Medicine

## 2010-10-16 DIAGNOSIS — M810 Age-related osteoporosis without current pathological fracture: Secondary | ICD-10-CM | POA: Insufficient documentation

## 2010-10-16 DIAGNOSIS — J309 Allergic rhinitis, unspecified: Secondary | ICD-10-CM | POA: Insufficient documentation

## 2010-10-16 DIAGNOSIS — L408 Other psoriasis: Secondary | ICD-10-CM | POA: Insufficient documentation

## 2010-10-16 DIAGNOSIS — K219 Gastro-esophageal reflux disease without esophagitis: Secondary | ICD-10-CM | POA: Insufficient documentation

## 2010-10-16 DIAGNOSIS — Z45018 Encounter for adjustment and management of other part of cardiac pacemaker: Secondary | ICD-10-CM | POA: Insufficient documentation

## 2010-10-16 DIAGNOSIS — Z01812 Encounter for preprocedural laboratory examination: Secondary | ICD-10-CM | POA: Insufficient documentation

## 2010-10-16 DIAGNOSIS — I10 Essential (primary) hypertension: Secondary | ICD-10-CM | POA: Insufficient documentation

## 2010-10-16 DIAGNOSIS — I4891 Unspecified atrial fibrillation: Secondary | ICD-10-CM | POA: Insufficient documentation

## 2010-10-16 DIAGNOSIS — I498 Other specified cardiac arrhythmias: Secondary | ICD-10-CM

## 2010-10-16 DIAGNOSIS — Z79899 Other long term (current) drug therapy: Secondary | ICD-10-CM | POA: Insufficient documentation

## 2010-10-16 DIAGNOSIS — M1990 Unspecified osteoarthritis, unspecified site: Secondary | ICD-10-CM | POA: Insufficient documentation

## 2010-10-16 LAB — SURGICAL PCR SCREEN: MRSA, PCR: POSITIVE — AB

## 2010-10-27 ENCOUNTER — Ambulatory Visit (INDEPENDENT_AMBULATORY_CARE_PROVIDER_SITE_OTHER): Payer: Medicare Other | Admitting: *Deleted

## 2010-10-27 ENCOUNTER — Encounter: Payer: Self-pay | Admitting: Internal Medicine

## 2010-10-27 DIAGNOSIS — I4891 Unspecified atrial fibrillation: Secondary | ICD-10-CM

## 2010-10-27 DIAGNOSIS — I495 Sick sinus syndrome: Secondary | ICD-10-CM

## 2010-10-27 LAB — PACEMAKER DEVICE OBSERVATION
AL AMPLITUDE: 5.6 mv
BAMS-0001: 150 {beats}/min
BATTERY VOLTAGE: 2.78 V
RV LEAD IMPEDENCE PM: 707 Ohm
VENTRICULAR PACING PM: 46

## 2010-10-27 NOTE — Progress Notes (Signed)
Pacer check in clinic  

## 2010-11-05 ENCOUNTER — Encounter: Payer: Self-pay | Admitting: Internal Medicine

## 2010-11-05 ENCOUNTER — Ambulatory Visit (INDEPENDENT_AMBULATORY_CARE_PROVIDER_SITE_OTHER): Payer: Medicare Other | Admitting: Internal Medicine

## 2010-11-05 VITALS — BP 189/76 | HR 65 | Temp 99.4°F | Wt 140.0 lb

## 2010-11-05 DIAGNOSIS — L02519 Cutaneous abscess of unspecified hand: Secondary | ICD-10-CM

## 2010-11-05 DIAGNOSIS — L03113 Cellulitis of right upper limb: Secondary | ICD-10-CM

## 2010-11-05 MED ORDER — CEFTRIAXONE SODIUM 1 G IJ SOLR
1.0000 g | Freq: Once | INTRAMUSCULAR | Status: AC
  Administered 2010-11-05: 1 g via INTRAMUSCULAR

## 2010-11-05 MED ORDER — CEPHALEXIN 500 MG PO CAPS: 500.0000 mg | ORAL_CAPSULE | Freq: Four times a day (QID) | ORAL | Status: DC

## 2010-11-05 NOTE — Assessment & Plan Note (Signed)
Progressed rapidly and now crosses into forearm No obvious joint involvement but has moved fast  Will give rocephin and then cephalexin See again if not much better by 10/5

## 2010-11-05 NOTE — Patient Instructions (Signed)
Please call for reevaluation by Friday if your hand and wrist are not much better

## 2010-11-05 NOTE — Progress Notes (Signed)
Subjective:    Patient ID: Carla Mendez, female    DOB: 10-Aug-1923, 77 y.o.   MRN: [id number]  HPI Woke yesterday with swelling in right hand between the 2nd and 3rd fingers Worsened last night Awoken by pain last night  No fever at home but felt warm this Am (per [facility] aide) No known trauma or bites  Did try some heat today  Current Outpatient Prescriptions on File Prior to Visit  Medication Sig Dispense Refill  . amiodarone (PACERONE) 200 MG tablet Take 1 tablet (200 mg total) by mouth daily.  30 tablet  6  . furosemide (LASIX) 20 MG tablet Take 1 tablet by mouth once daily as needed      . lidocaine (LIDODERM) 5 % Place 1 patch onto the skin daily. Remove & Discard patch within 12 hours or as directed by MD  30 patch  11  . lisinopril (PRINIVIL,ZESTRIL) 20 MG tablet Take 0.5 tablets (10 mg total) by mouth daily.  30 tablet  6  . meclizine (ANTIVERT) 25 MG tablet Take 25 mg by mouth 3 (three) times daily as needed.        . pantoprazole (PROTONIX) 40 MG tablet Take 1 tablet (40 mg total) by mouth daily.  30 tablet  11  . Phenylephrine-Acetaminophen ([employer]S DAYTIME SINUS RELIEF) 5-325 MG CAPS Take 1 capsule by mouth as needed.        . traMADol (ULTRAM) 50 MG tablet Take 50-100 mg by mouth 3 (three) times daily as needed.        . triamcinolone (KENALOG) 0.1 % cream Apply 1 application topically 2 (two) times daily.          Allergies  Allergen Reactions  . Ibuprofen     REACTION: swelling  . Mobic Swelling    Past Medical History  Diagnosis Date  . Allergy   . Atrial fibrillation   . GERD (gastroesophageal reflux disease)   . Hypertension   . Osteoporosis     osteopenia, osteoarthritis  . Renal insufficiency   . Allergic rhinitis   . Psoriasis     Past Surgical History  Procedure Date  . Cholecystectomy   . Abdominal hysterectomy   . Appendicitis   . Esophageal dilation     Family History  Problem Relation Age of Onset  . Heart disease Mother   .  Cancer Father     kidney  . Heart disease Father   . Hypertension Brother   . Heart disease Brother   . Breast cancer Neg Hx   . Colon cancer Neg Hx     History   Social History  . Marital Status: Married    Spouse Name: N/A    Number of Children: 4  . Years of Education: N/A   Occupational History  .     Social History Main Topics  . Smoking status: Never Smoker   . Smokeless tobacco: Never Used  . Alcohol Use: No  . Drug Use: No  . Sexually Active: Not on file   Other Topics Concern  . Not on file   Social History Narrative  . No narrative on file   Review of Systems No cough or SOB Pacemaker replaced ~2 weeks ago No sig GI problems     Objective:   Physical Exam  Constitutional: She appears well-developed and well-nourished. No distress.  Musculoskeletal:       Mild pain with passive ROM of right wrist but may be due to touching overlying  skin No apparent effusion there  Skin:       Redness, warmth and tenderness from ~6cm proximal to right wrist to almost MCP joints Psoriatic lesion in the middle of it but no apparent open areas          Assessment & Plan:

## 2010-11-06 ENCOUNTER — Encounter: Payer: Self-pay | Admitting: Family Medicine

## 2010-11-06 ENCOUNTER — Ambulatory Visit (INDEPENDENT_AMBULATORY_CARE_PROVIDER_SITE_OTHER): Payer: Medicare Other | Admitting: Family Medicine

## 2010-11-06 DIAGNOSIS — L02519 Cutaneous abscess of unspecified hand: Secondary | ICD-10-CM

## 2010-11-06 DIAGNOSIS — L03113 Cellulitis of right upper limb: Secondary | ICD-10-CM

## 2010-11-06 NOTE — Patient Instructions (Signed)
RTC Mon for quick recheck.

## 2010-11-06 NOTE — Assessment & Plan Note (Signed)
After spending a long time trying to determine extent of improvement, was going to give another shot of Rocephin and continue the Keflex. She has tolerated all of these well. About this time, Dr Karle Starch nurse came by and was able to give witness to the fact that the swelling was significantly improved and the redness had lessened. Will not give the Rochepin knowing there has been improvement. Recheck on Mon to insure continued improvement.

## 2010-11-06 NOTE — Progress Notes (Signed)
  Subjective:    Patient ID: Carla Mendez, female    DOB: [birth year], 77 y.o.   MRN: [id number]  HPI Pt is 77yo pt of Dr Alphonsus Sias seen yesterday for cellulitis of the hand and put on Abs yesterday who is here with [facility] nurse as acute appt for recheck. She was concerned about response of her hand to the trmt given yesterday which was a shot of 1 gm of Rocephin and started on oral Keflex and she had had 4 of those so far. She is unable to tell me if it has improved altho she did notice she had more space between her fingers, presumably meaning the swelling has gone down. She denies ongoing fever or chills. She is tender to palpation globally.    Review of SystemsNoncontributory except as above.       Objective:   Physical Exam  Constitutional: She appears well-developed and well-nourished. No distress.  HENT:  Head: Normocephalic and atraumatic.  Right Ear: External ear normal.  Left Ear: External ear normal.  Nose: Nose normal.  Mouth/Throat: Oropharynx is clear and moist. No oropharyngeal exudate.  Eyes: Conjunctivae and EOM are normal. Pupils are equal, round, and reactive to light.  Neck: Normal range of motion. Neck supple. No thyromegaly present.  Cardiovascular: Normal rate, regular rhythm and normal heart sounds.   Pulmonary/Chest: Effort normal and breath sounds normal. She has no wheezes. She has no rales.  Lymphadenopathy:    She has no cervical adenopathy.  Skin: Skin is warm and dry. No rash noted. She is not diaphoretic. There is erythema.       Swelling and erythema of right hand up proximally to mid forearm with psoriatic plaque and macular rash to past the elbow with swelling principally of the dorsal surface of the hand and some of the general wrist.          Assessment & Plan:

## 2010-11-10 ENCOUNTER — Encounter: Payer: Self-pay | Admitting: Family Medicine

## 2010-11-10 ENCOUNTER — Ambulatory Visit (INDEPENDENT_AMBULATORY_CARE_PROVIDER_SITE_OTHER): Payer: Medicare Other | Admitting: Family Medicine

## 2010-11-10 VITALS — BP 140/70 | HR 64 | Temp 97.9°F | Ht 62.0 in | Wt 136.5 lb

## 2010-11-10 DIAGNOSIS — L03113 Cellulitis of right upper limb: Secondary | ICD-10-CM

## 2010-11-10 DIAGNOSIS — L02519 Cutaneous abscess of unspecified hand: Secondary | ICD-10-CM

## 2010-11-10 MED ORDER — DOXYCYCLINE HYCLATE 100 MG PO CAPS: 100.0000 mg | ORAL_CAPSULE | Freq: Two times a day (BID) | ORAL | Status: AC

## 2010-11-10 NOTE — Patient Instructions (Signed)
Good to see you. Please stop taking the Keflex (cephalexin). Start taking Doxycyline 100 mg twice daily for ten days. Call me tomorrow with an update.

## 2010-11-10 NOTE — Progress Notes (Signed)
Subjective:    Patient ID: Carla Mendez, female    DOB: 1924-01-22, 77 y.o.   MRN: [id number]  HPI    77yo pt of Carla Mendez seen on 10/3 and 10/4 for cellulitis. Given IM 1 gm of Rocephin on 10/3 and started on 10 day course of oral Keflex four times daily.  At follow up on 10/4, per Carla. Lorenza Mendez note, symptoms were improving.   She returns today and states symptoms have been getting much worse over the weekend. Swelling and redness worsened, now spread up to her elbow.   She denies ongoing fever but did feel chilly last night. It is tender with movement.  Of note, she did have pacemaker generator changed last month.    Patient Active Problem List  Diagnoses  . HYPERTENSION  . ATRIAL FIBRILLATION  . BRADYCARDIA  . ALLERGIC RHINITIS  . RENAL INSUFFICIENCY  . PSORIASIS  . OSTEOARTHRITIS  . DEGENERATIVE DISC DISEASE, LUMBAR SPINE  . OSTEOPENIA  . VERTIGO  . IMPAIRED FASTING GLUCOSE  . Skin tear  . Orthostatic hypotension  . Pacemaker  . Cellulitis of hand, right   Past Medical History  Diagnosis Date  . Allergy   . Atrial fibrillation   . GERD (gastroesophageal reflux disease)   . Hypertension   . Osteoporosis     osteopenia, osteoarthritis  . Renal insufficiency   . Allergic rhinitis   . Psoriasis    Past Surgical History  Procedure Date  . Cholecystectomy   . Abdominal hysterectomy   . Appendicitis   . Esophageal dilation    History  Substance Use Topics  . Smoking status: Never Smoker   . Smokeless tobacco: Never Used  . Alcohol Use: No   Family History  Problem Relation Age of Onset  . Heart disease Mother   . Cancer Father     kidney  . Heart disease Father   . Hypertension Brother   . Heart disease Brother   . Breast cancer Neg Hx   . Colon cancer Neg Hx    Allergies  Allergen Reactions  . Ibuprofen     REACTION: swelling  . Mobic Swelling   Current Outpatient Prescriptions on File Prior to Visit  Medication Sig Dispense  Refill  . amiodarone (PACERONE) 200 MG tablet Take 1 tablet (200 mg total) by mouth daily.  30 tablet  6  . cephALEXin (KEFLEX) 500 MG capsule Take 1 capsule (500 mg total) by mouth 4 (four) times daily.  40 capsule  0  . furosemide (LASIX) 20 MG tablet Take 1 tablet by mouth once daily as needed      . lidocaine (LIDODERM) 5 % Place 1 patch onto the skin daily. Remove & Discard patch within 12 hours or as directed by MD  30 patch  11  . lisinopril (PRINIVIL,ZESTRIL) 20 MG tablet Take 0.5 tablets (10 mg total) by mouth daily.  30 tablet  6  . meclizine (ANTIVERT) 25 MG tablet Take 25 mg by mouth 3 (three) times daily as needed.        . pantoprazole (PROTONIX) 40 MG tablet Take 1 tablet (40 mg total) by mouth daily.  30 tablet  11  . Phenylephrine-Acetaminophen ([employer]S DAYTIME SINUS RELIEF) 5-325 MG CAPS Take 1 capsule by mouth as needed.        . traMADol (ULTRAM) 50 MG tablet Take 50-100 mg by mouth 3 (three) times daily as needed.        . triamcinolone (KENALOG) 0.1 %  cream Apply 1 application topically 2 (two) times daily.         The PMH, PSH, Social History, Family History, Medications, and allergies have been reviewed in [facility], and have been updated if relevant.   Review of Systems See HPI    Objective:   Physical Exam  BP 140/70  Pulse 64  Temp(Src) 97.9 F (36.6 C) (Oral)  Ht 5\' 2"  (1.575 m)  Wt 136 lb 8 oz (61.916 kg)  BMI 24.97 kg/m2  Constitutional: She appears well-developed and well-nourished. No distress.  HENT:  Head: Normocephalic and atraumatic.  Right Ear: External ear normal.  Left Ear: External ear normal.  Nose: Nose normal.  Mouth/Throat: Oropharynx is clear and moist. No oropharyngeal exudate.  Eyes: Conjunctivae and EOM are normal. Pupils are equal, round, and reactive to light.  Neck: Normal range of motion. Neck supple. No thyromegaly present.  Cardiovascular: Normal rate, regular rhythm and normal heart sounds.   Pulmonary/Chest: Effort normal and breath  sounds normal. She has no wheezes. She has no rales.  Lymphadenopathy:    She has no cervical adenopathy.  Skin: Skin is warm and dry. No rash noted. She is not diaphoretic. There is erythema.       Swelling and erythema of right hand up proximally to elbow with psoriatic plaque and macular rash to past the elbow. Right hand swollen, pulses equal.     Assessment & Plan:   1. Cellulitis of hand, right    Deteriorated. Will d/c keflex, start doxycyline. Discussed with pt and her caregiver- if no improvement or worsening in 24, I would like to rule out DVT although not classical presentation. The patient indicates understanding of these issues and agrees with the plan.

## 2010-11-13 NOTE — Op Note (Signed)
  [name], Carla Mendez              ACCOUNT NO.:  [id number]  MEDICAL RECORD NO.:  [id number]  LOCATION:  [facility]                         FACILITY:  [facility]  PHYSICIAN:  Doylene Canning. Ladona Ridgel, MD    DATE OF BIRTH:  01/23/1924  DATE OF PROCEDURE:  10/16/2010 DATE OF DISCHARGE:                              OPERATIVE REPORT   PROCEDURE PERFORMED:  Removal of previously implanted dual-chamber pacemaker, which had reached elective replacement and insertion of a new dual-chamber device.  INDICATION:  Symptomatic bradycardia.  INTRODUCTION:  The patient is an 77 year old woman with a history of symptomatic bradycardia status post pacemaker insertion.  She has reached elective replacement indication and is now referred for pacemaker generator change.  PROCEDURE:  After informed consent was obtained, the patient was taken to the diagnostic EP lab in a fasting state.  After usual preparation and draping, intravenous fentanyl and midazolam was given for sedation. 30 mL of lidocaine was infiltrated into the left infraclavicular region. A 5-cm incision was carried out over this region.  Electrocautery was utilized to dissect down to the pacemaker pocket.  This was entered without difficulty.  Gentle traction was utilized to remove the old pacemaker from its pocket.  Electrocautery was utilized to free up the fibrous adhesions.  The can was removed from the leads.  The leads were evaluated and found to be working normally.  The new Medtronic Sensia dual-chamber pacemaker, serial number [id number] was connected to the atrium and RV leads and placed back in the subcutaneous pocket.  The pocket was irrigated with antibiotic irrigation.  The incision was closed with 2-0 and 3-0 Vicryl.  Benzoin and Steri-Strips were painted on the skin, a pressure dressing was applied, and the patient was returned to her room in satisfactory condition.  COMPLICATIONS:  There were no immediate procedure  complications.  RESULTS:  This demonstrates successful removal of previously implanted dual-chamber pacemaker, which reached elective replacement indication and insertion of a new dual-chamber device without immediate procedure complications.     Doylene Canning. Ladona Ridgel, MD     GWT/MEDQ  D:  10/16/2010  T:  10/16/2010  Job:  [id number]  Electronically Signed by Lewayne Bunting MD on 11/13/2010 06:46:17 PM

## 2010-11-24 ENCOUNTER — Ambulatory Visit (INDEPENDENT_AMBULATORY_CARE_PROVIDER_SITE_OTHER): Payer: Medicare Other | Admitting: Internal Medicine

## 2010-11-24 ENCOUNTER — Encounter: Payer: Self-pay | Admitting: Internal Medicine

## 2010-11-24 VITALS — BP 137/56 | HR 66 | Temp 99.0°F | Ht 62.0 in | Wt 133.0 lb

## 2010-11-24 DIAGNOSIS — I10 Essential (primary) hypertension: Secondary | ICD-10-CM

## 2010-11-24 DIAGNOSIS — L408 Other psoriasis: Secondary | ICD-10-CM

## 2010-11-24 DIAGNOSIS — Z23 Encounter for immunization: Secondary | ICD-10-CM

## 2010-11-24 DIAGNOSIS — I4891 Unspecified atrial fibrillation: Secondary | ICD-10-CM

## 2010-11-24 DIAGNOSIS — M5137 Other intervertebral disc degeneration, lumbosacral region: Secondary | ICD-10-CM

## 2010-11-24 NOTE — Assessment & Plan Note (Signed)
Paced rhthym now On amiodarone

## 2010-11-24 NOTE — Assessment & Plan Note (Signed)
Intermittent pain Uses the tramadol rarely

## 2010-11-24 NOTE — Progress Notes (Signed)
Subjective:    Patient ID: Carla Mendez, female    DOB: 01-08-1924, 77 y.o.   MRN: [id number]  HPI Here with [facility] aide  Hand had been better but now with lesion on left distal forearm and back on right hand again Some burning Does have appt with dermatologist in 4 days--getting methotrexate apparently for her psoriasis  Still has occ orthostatic dizziness Not as bad as before No syncope No energy still though Has 24 hour home care Independent with ADLs still  occ helps with cooking  Does have bad days with back pain Mostly if she is moving around Done with Dr Ananias Pilgrim want any more shots with the fungal contamination mess  Occ gets tight feeling in chest after coughing No chest pain otherwise No palpitations Breathing has been fine  Current Outpatient Prescriptions on File Prior to Visit  Medication Sig Dispense Refill  . amiodarone (PACERONE) 200 MG tablet Take 1 tablet (200 mg total) by mouth daily.  30 tablet  6  . furosemide (LASIX) 20 MG tablet Take 1 tablet by mouth once daily as needed      . lidocaine (LIDODERM) 5 % Place 1 patch onto the skin daily. Remove & Discard patch within 12 hours or as directed by MD  30 patch  11  . lisinopril (PRINIVIL,ZESTRIL) 20 MG tablet Take 0.5 tablets (10 mg total) by mouth daily.  30 tablet  6  . meclizine (ANTIVERT) 25 MG tablet Take 25 mg by mouth 3 (three) times daily as needed.        . pantoprazole (PROTONIX) 40 MG tablet Take 1 tablet (40 mg total) by mouth daily.  30 tablet  11  . Phenylephrine-Acetaminophen ([employer] DAYTIME SINUS RELIEF) 5-325 MG CAPS Take 1 capsule by mouth as needed.        . traMADol (ULTRAM) 50 MG tablet Take 50-100 mg by mouth 3 (three) times daily as needed.        . triamcinolone (KENALOG) 0.1 % cream Apply 1 application topically 2 (two) times daily.          Allergies  Allergen Reactions  . Ibuprofen     REACTION: swelling  . Mobic Swelling    Past Medical History  Diagnosis  Date  . Allergy   . Atrial fibrillation   . GERD (gastroesophageal reflux disease)   . Hypertension   . Osteoporosis     osteopenia, osteoarthritis  . Renal insufficiency   . Allergic rhinitis   . Psoriasis     Past Surgical History  Procedure Date  . Cholecystectomy   . Abdominal hysterectomy   . Appendicitis   . Esophageal dilation     Family History  Problem Relation Age of Onset  . Heart disease Mother   . Cancer Father     kidney  . Heart disease Father   . Hypertension Brother   . Heart disease Brother   . Breast cancer Neg Hx   . Colon cancer Neg Hx     History   Social History  . Marital Status: Married    Spouse Name: N/A    Number of Children: 4  . Years of Education: N/A   Occupational History  .     Social History Main Topics  . Smoking status: Never Smoker   . Smokeless tobacco: Never Used  . Alcohol Use: No  . Drug Use: No  . Sexually Active: Not on file   Other Topics Concern  . Not on file  Social History Narrative  . No narrative on file   Review of Systems Appetite has gone up recently Weight is down a bit     Objective:   Physical Exam  Constitutional: She appears well-developed and well-nourished. No distress.  Neck: Normal range of motion. Neck supple.  Cardiovascular: Normal rate, regular rhythm and normal heart sounds.  Exam reveals no gallop.   No murmur heard. Pulmonary/Chest: Effort normal and breath sounds normal. No respiratory distress. She has no wheezes. She has no rales.  Musculoskeletal: She exhibits no edema and no tenderness.  Lymphadenopathy:    She has no cervical adenopathy.  Skin:       Plaque like lesions on distal forearms and onto right thumb  Psychiatric: Her behavior is normal. Thought content normal.       Mood is neutral Slightly flat affect          Assessment & Plan:

## 2010-11-24 NOTE — Assessment & Plan Note (Signed)
BP Readings from Last 3 Encounters:  11/24/10 137/56  11/10/10 140/70  11/06/10 138/88   Good control Not having as much orthostasis No changes now  Lab Results  Component Value Date   CREATININE 1.6* 10/08/2010

## 2010-11-24 NOTE — Assessment & Plan Note (Signed)
Lesions look psoriatic No evidence of ongoing infection Just restarted her methotrexate

## 2010-12-10 ENCOUNTER — Ambulatory Visit (HOSPITAL_COMMUNITY)
Admission: RE | Admit: 2010-12-10 | Discharge: 2010-12-10 | Disposition: A | Discharge status: Home / Self Care | Payer: Medicare Other | Source: Ambulatory Visit | Attending: Cardiology | Admitting: Cardiology

## 2010-12-10 DIAGNOSIS — Z79899 Other long term (current) drug therapy: Secondary | ICD-10-CM | POA: Insufficient documentation

## 2010-12-10 DIAGNOSIS — I4891 Unspecified atrial fibrillation: Secondary | ICD-10-CM | POA: Insufficient documentation

## 2011-02-24 ENCOUNTER — Encounter: Payer: Self-pay | Admitting: Internal Medicine

## 2011-02-24 ENCOUNTER — Ambulatory Visit (INDEPENDENT_AMBULATORY_CARE_PROVIDER_SITE_OTHER): Payer: Medicare Other | Admitting: Internal Medicine

## 2011-02-24 DIAGNOSIS — I4891 Unspecified atrial fibrillation: Secondary | ICD-10-CM

## 2011-02-24 DIAGNOSIS — I10 Essential (primary) hypertension: Secondary | ICD-10-CM

## 2011-02-24 DIAGNOSIS — Z950 Presence of cardiac pacemaker: Secondary | ICD-10-CM

## 2011-02-24 DIAGNOSIS — I498 Other specified cardiac arrhythmias: Secondary | ICD-10-CM

## 2011-02-24 LAB — PACEMAKER DEVICE OBSERVATION
AL IMPEDENCE PM: 469 Ohm
BATTERY VOLTAGE: 2.77 V
RV LEAD AMPLITUDE: 11.2 mv
RV LEAD IMPEDENCE PM: 660 Ohm

## 2011-02-24 NOTE — Assessment & Plan Note (Signed)
Her device is working normally. We'll plan to recheck in several months. 

## 2011-02-24 NOTE — Progress Notes (Signed)
HPI Mrs. Spiker returns today for followup. She is a very pleasant 78 year old woman with a history of symptomatic bradycardia status post pacemaker insertion, atrial fibrillation maintaining sinus rhythm on amiodarone, hypertension, and a predilection to fall. She is not Coumadin for the above reason. She denies chest pain, shortness of breath except with exertion, or syncope. She does have some difficulty with her balance. Allergies  Allergen Reactions  . Ibuprofen     REACTION: swelling  . Mobic Swelling     Current Outpatient Prescriptions  Medication Sig Dispense Refill  . amiodarone (PACERONE) 200 MG tablet Take 1 tablet (200 mg total) by mouth daily.  30 tablet  6  . cholecalciferol (VITAMIN D) 1000 UNITS tablet Take 1,000 Units by mouth daily.      . folic acid (FOLVITE) 1 MG tablet Take 1 mg by mouth daily.      . furosemide (LASIX) 20 MG tablet Take 1 tablet by mouth once daily as needed      . lidocaine (LIDODERM) 5 % Place 1 patch onto the skin daily. Remove & Discard patch within 12 hours or as directed by MD  30 patch  11  . lisinopril (PRINIVIL,ZESTRIL) 20 MG tablet Take 20 mg by mouth daily.      . meclizine (ANTIVERT) 25 MG tablet Take 25 mg by mouth 3 (three) times daily as needed.        . methotrexate (RHEUMATREX) 2.5 MG tablet Take 5 mg by mouth every 7 (seven) days.      . pantoprazole (PROTONIX) 40 MG tablet Take 1 tablet (40 mg total) by mouth daily.  30 tablet  11  . Phenylephrine-Acetaminophen ([employer] DAYTIME SINUS RELIEF) 5-325 MG CAPS Take 1 capsule by mouth as needed.        . traMADol (ULTRAM) 50 MG tablet Take 50-100 mg by mouth 3 (three) times daily as needed.        . triamcinolone (KENALOG) 0.1 % cream Apply 1 application topically 2 (two) times daily.           Past Medical History  Diagnosis Date  . Allergy   . Atrial fibrillation   . GERD (gastroesophageal reflux disease)   . Hypertension   . Osteoporosis     osteopenia, osteoarthritis  . Renal  insufficiency   . Allergic rhinitis   . Psoriasis     ROS:   All systems reviewed and negative except as noted in the HPI.   Past Surgical History  Procedure Date  . Cholecystectomy   . Abdominal hysterectomy   . Appendicitis   . Esophageal dilation      Family History  Problem Relation Age of Onset  . Heart disease Mother   . Cancer Father     kidney  . Heart disease Father   . Hypertension Brother   . Heart disease Brother   . Breast cancer Neg Hx   . Colon cancer Neg Hx      History   Social History  . Marital Status: Married    Spouse Name: N/A    Number of Children: 4  . Years of Education: N/A   Occupational History  .     Social History Main Topics  . Smoking status: Never Smoker   . Smokeless tobacco: Never Used  . Alcohol Use: No  . Drug Use: No  . Sexually Active: Not on file   Other Topics Concern  . Not on file   Social History Narrative  .  No narrative on file     BP 126/72  Pulse 66  Ht 5\' 3"  (1.6 m)  Wt 64.411 kg (142 lb)  BMI 25.15 kg/m2  Physical Exam:  Well appearing elderly woman, NAD HEENT: Unremarkable Neck:  No JVD, no thyromegally Lungs:  Clear with no wheezes, rales, or rhonchi. HEART:  Regular rate rhythm, no murmurs, no rubs, no clicks Abd:  soft, positive bowel sounds, no organomegally, no rebound, no guarding Ext:  2 plus pulses, no edema, no cyanosis, no clubbing Skin:  No rashes no nodules Neuro:  CN II through XII intact, motor grossly intact  DEVICE  Normal device function.  See PaceArt for details.   Assess/Plan:

## 2011-02-24 NOTE — Assessment & Plan Note (Signed)
She appears to be maintaining sinus rhythm very nicely. I have asked her to reduce her dose of amiodarone to one tablet Monday through Friday and half tablet Saturday and Sunday.

## 2011-02-24 NOTE — Assessment & Plan Note (Signed)
Her blood pressure today is well controlled. She will continue her current medical therapy and maintain a low-sodium diet. 

## 2011-02-24 NOTE — Patient Instructions (Signed)
Your physician wants you to follow-up in: 12 months with Dr Court Joy will receive a reminder letter in the mail two months in advance. If you don't receive a letter, please call our office to schedule the follow-up appointment.   Your physician has recommended you make the following change in your medication:  1) Decrease Amiodarone to 200mg  Mon-Fri(1 tablet) and 100mg  Sat and Sun(1/2 tablet)

## 2011-03-02 ENCOUNTER — Ambulatory Visit: Payer: Medicare Other | Admitting: Internal Medicine

## 2011-03-02 DIAGNOSIS — Z0289 Encounter for other administrative examinations: Secondary | ICD-10-CM

## 2011-03-06 HISTORY — PX: BASAL CELL CARCINOMA EXCISION: SHX1214

## 2011-04-20 ENCOUNTER — Encounter: Payer: Self-pay | Admitting: Internal Medicine

## 2011-05-13 ENCOUNTER — Other Ambulatory Visit: Payer: Self-pay | Admitting: *Deleted

## 2011-05-13 MED ORDER — NYSTATIN 100000 UNIT/GM EX POWD: Freq: Two times a day (BID) | CUTANEOUS | Status: AC | PRN

## 2011-05-13 NOTE — Telephone Encounter (Signed)
Okay to send Rx 1 container x 1 refill  Use bid prn for rash or irritation

## 2011-05-13 NOTE — Telephone Encounter (Signed)
rx sent to pharmacy by e-script  

## 2011-05-13 NOTE — Telephone Encounter (Signed)
Pharmacist calling asking for rx for nystatin power, pt's husband was using this(MYCOSTATIN 100000 UNIT/GM POWD (NYSTATIN) Apply 1 as directed to skin three times a day)  she used some of his and now would like a rx. Please advise on rx and refill

## 2011-05-29 ENCOUNTER — Other Ambulatory Visit: Payer: Self-pay | Admitting: *Deleted

## 2011-05-29 DIAGNOSIS — I4891 Unspecified atrial fibrillation: Secondary | ICD-10-CM

## 2011-05-29 MED ORDER — AMIODARONE HCL 200 MG PO TABS: ORAL_TABLET | ORAL | Status: DC

## 2011-05-29 MED ORDER — PANTOPRAZOLE SODIUM 40 MG PO TBEC: 40.0000 mg | DELAYED_RELEASE_TABLET | Freq: Every day | ORAL | Status: DC

## 2011-05-29 NOTE — Telephone Encounter (Signed)
Refilled Amiodarone HCL 200mg .

## 2011-06-11 ENCOUNTER — Ambulatory Visit: Payer: Medicare Other | Admitting: Cardiovascular Disease

## 2011-07-06 ENCOUNTER — Telehealth: Payer: Self-pay | Admitting: Internal Medicine

## 2011-07-06 NOTE — Telephone Encounter (Signed)
Requesting refills on prescriptions for Tramadol and anti-diuretic.  Please call patient at [phone]

## 2011-07-07 ENCOUNTER — Other Ambulatory Visit: Payer: Self-pay

## 2011-07-07 MED ORDER — FUROSEMIDE 20 MG PO TABS: 20.0000 mg | ORAL_TABLET | Freq: Every day | ORAL | Status: DC | PRN

## 2011-07-07 NOTE — Telephone Encounter (Signed)
Pt request refill Tramadol and Lasix sent to [facility] pharmacy. Please advise.

## 2011-07-07 NOTE — Telephone Encounter (Signed)
See 07/07/11 refill request

## 2011-07-08 MED ORDER — TRAMADOL HCL 50 MG PO TABS: 50.0000 mg | ORAL_TABLET | Freq: Three times a day (TID) | ORAL | Status: DC | PRN

## 2011-07-08 NOTE — Telephone Encounter (Signed)
Okay lasix #30 x 3 Tramadol #100 x 0  She is due for follow up--have her schedule in the next 1-2 months

## 2011-07-08 NOTE — Telephone Encounter (Signed)
rx sent to pharmacy by e-script  

## 2011-07-27 ENCOUNTER — Telehealth: Payer: Self-pay | Admitting: Family Medicine

## 2011-07-27 ENCOUNTER — Ambulatory Visit (INDEPENDENT_AMBULATORY_CARE_PROVIDER_SITE_OTHER): Payer: Medicare Other | Admitting: Family Medicine

## 2011-07-27 ENCOUNTER — Ambulatory Visit: Payer: Medicare Other | Admitting: Internal Medicine

## 2011-07-27 ENCOUNTER — Encounter: Payer: Self-pay | Admitting: Family Medicine

## 2011-07-27 VITALS — BP 140/62 | HR 68 | Temp 97.3°F | Wt 139.0 lb

## 2011-07-27 DIAGNOSIS — I10 Essential (primary) hypertension: Secondary | ICD-10-CM

## 2011-07-27 DIAGNOSIS — N259 Disorder resulting from impaired renal tubular function, unspecified: Secondary | ICD-10-CM

## 2011-07-27 MED ORDER — LISINOPRIL 20 MG PO TABS: 20.0000 mg | ORAL_TABLET | Freq: Every day | ORAL | Status: DC

## 2011-07-27 NOTE — Telephone Encounter (Signed)
Call pt.  Have her stop lisinopril, needs OV with me or another doc on Wed/Thursday.  Needs to recheck renal function at the visit.  Doesn't need to fast.  Thanks.

## 2011-07-27 NOTE — Patient Instructions (Signed)
Stop lasix (furosemide). Please stop by to see Shirlee Limerick on your way out.

## 2011-07-27 NOTE — Progress Notes (Signed)
Subjective:    Patient ID: Carla Mendez, female    DOB: November 18, 1923, 78 y.o.   MRN: [id number]  HPI  Here because dermatologist told her she needed a referral to a kidney doctor.  Awaiting labs from dermatology office but labs in Epic show Cr was 1.9 1 year ago, GFR 27.59.  Per pt, dermatologist told her to stop taking MTX for her psorasis.  She does take Lasix for "fluid," CHF not listed although she is on amiodarone for A fib.  She denies any discomfort and otherwise is doing ok.   Current Outpatient Prescriptions on File Prior to Visit  Medication Sig Dispense Refill  . amiodarone (PACERONE) 200 MG tablet Take 1 tablet by mouth Mon-Fri and 1/2 tablet Sat and Sun  30 tablet  6  . cholecalciferol (VITAMIN D) 1000 UNITS tablet Take 1,000 Units by mouth daily.      . folic acid (FOLVITE) 1 MG tablet Take 1 mg by mouth daily.      . furosemide (LASIX) 20 MG tablet Take 1 tablet (20 mg total) by mouth daily as needed.  30 tablet  11  . lidocaine (LIDODERM) 5 % Place 1 patch onto the skin daily. Remove & Discard patch within 12 hours or as directed by MD  30 patch  11  . lisinopril (PRINIVIL,ZESTRIL) 20 MG tablet Take 20 mg by mouth daily.      . meclizine (ANTIVERT) 25 MG tablet Take 25 mg by mouth 3 (three) times daily as needed.        . methotrexate (RHEUMATREX) 2.5 MG tablet Take one by mouth everyday, except take 2 on fridays.      Marland Kitchen nystatin (MYCOSTATIN) powder Apply topically 2 (two) times daily as needed.  30 g  1  . pantoprazole (PROTONIX) 40 MG tablet Take 1 tablet (40 mg total) by mouth daily.  30 tablet  11  . Phenylephrine-Acetaminophen ([employer]S DAYTIME SINUS RELIEF) 5-325 MG CAPS Take 1 capsule by mouth as needed.        . traMADol (ULTRAM) 50 MG tablet Take 1-2 tablets (50-100 mg total) by mouth 3 (three) times daily as needed.  100 tablet  0  . triamcinolone (KENALOG) 0.1 % cream Apply 1 application topically 2 (two) times daily.          Allergies  Allergen Reactions    . Ibuprofen     REACTION: swelling  . Meloxicam Swelling    Past Medical History  Diagnosis Date  . Allergy   . Atrial fibrillation   . GERD (gastroesophageal reflux disease)   . Hypertension   . Osteoporosis     osteopenia, osteoarthritis  . Renal insufficiency   . Allergic rhinitis   . Psoriasis     Past Surgical History  Procedure Date  . Cholecystectomy   . Abdominal hysterectomy   . Appendicitis   . Esophageal dilation   . Basal cell carcinoma excision 2/13    Moh's surgery at [facility]--on face    Family History  Problem Relation Age of Onset  . Heart disease Mother   . Cancer Father     kidney  . Heart disease Father   . Hypertension Brother   . Heart disease Brother   . Breast cancer Neg Hx   . Colon cancer Neg Hx     History   Social History  . Marital Status: Married    Spouse Name: N/A    Number of Children: 4  . Years of Education:  N/A   Occupational History  .     Social History Main Topics  . Smoking status: Never Smoker   . Smokeless tobacco: Never Used  . Alcohol Use: No  . Drug Use: No  . Sexually Active: Not on file   Other Topics Concern  . Not on file   Social History Narrative  . No narrative on file   Review of Systems See HPI No decreased appetite- weight stable. No n/v/d     Objective:   Physical Exam  BP 140/62  Pulse 68  Temp 97.3 F (36.3 C)  Wt 139 lb (63.05 kg) Wt Readings from Last 3 Encounters:  07/27/11 139 lb (63.05 kg)  02/24/11 142 lb (64.411 kg)  11/24/10 133 lb (60.328 kg)    Constitutional: She appears well-developed and well-nourished. No distress.  Neck: Normal range of motion. Neck supple.  Cardiovascular: Normal rate, regular rhythm and normal heart sounds.  Exam reveals no gallop.   No murmur heard. Pulmonary/Chest: Effort normal and breath sounds normal. No respiratory distress. She has no wheezes. She has no rales.  Musculoskeletal: She exhibits no edema and no tenderness.   Lymphadenopathy:    She has no cervical adenopathy.  Skin:       Plaque like lesions on distal forearms Psychiatric: Her behavior is normal. Thought content normal.   Ext:  No LE edema.    Assessment & Plan:   1. RENAL INSUFFICIENCY  Ambulatory referral to Nephrology  Appears to be a chronic issue- requested labs from dermatologist which shows Cr 1.83, GFR 25 which is very close to her labs from last year. Will refer to nephrology per pt request although reassurance provided that there has not been much change to her renal function during the last year. MTX can cause nephrotoxicity so I agreed that she should stop taking this for now. I have also asked her to stop taking Lasix as she does not have CHF.  If swelling restarts, she is to contact us immediately. The patient indicates understanding of these issues and agrees with the plan.

## 2011-07-28 NOTE — Telephone Encounter (Signed)
I phoned the patient this morning and she advised me that she saw  Dr. Dayton Martes yesterday and was advised by her to stop the Lisinopril and she has an appt with a Urologist early next week.

## 2011-07-28 NOTE — Telephone Encounter (Signed)
I have d/w Dr. Dayton Martes re: recent labs.

## 2011-08-03 ENCOUNTER — Telehealth: Payer: Self-pay | Admitting: *Deleted

## 2011-08-03 NOTE — Telephone Encounter (Signed)
Yes she is being referred to a nephrologist (kidney doctor). Initially, labs from dermatologist were stable compared to prior labs. Then further labs were sent which did show an elevation in her creatinine and we referred her to a nephrologist.

## 2011-08-03 NOTE — Telephone Encounter (Signed)
Patient's son called stating that he was out of town last week and has been told by his mother that she is being referred to a urologist. Patient's son is confused as to why she needs to see a urologist because he has been told 2 different stories. Please call and advise him of what is going on.

## 2011-08-03 NOTE — Telephone Encounter (Signed)
Patient's son notified.

## 2011-08-11 ENCOUNTER — Other Ambulatory Visit: Payer: Self-pay | Admitting: *Deleted

## 2011-08-11 MED ORDER — MECLIZINE HCL 25 MG PO TABS: 25.0000 mg | ORAL_TABLET | Freq: Three times a day (TID) | ORAL | Status: AC | PRN

## 2011-08-12 ENCOUNTER — Ambulatory Visit (INDEPENDENT_AMBULATORY_CARE_PROVIDER_SITE_OTHER): Payer: Medicare Other | Admitting: Internal Medicine

## 2011-08-12 ENCOUNTER — Encounter: Payer: Self-pay | Admitting: Internal Medicine

## 2011-08-12 VITALS — BP 130/70 | HR 64 | Temp 98.6°F

## 2011-08-12 DIAGNOSIS — I4891 Unspecified atrial fibrillation: Secondary | ICD-10-CM

## 2011-08-12 DIAGNOSIS — I10 Essential (primary) hypertension: Secondary | ICD-10-CM

## 2011-08-12 DIAGNOSIS — N259 Disorder resulting from impaired renal tubular function, unspecified: Secondary | ICD-10-CM

## 2011-08-12 DIAGNOSIS — R42 Dizziness and giddiness: Secondary | ICD-10-CM | POA: Insufficient documentation

## 2011-08-12 LAB — BASIC METABOLIC PANEL
CO2: 25 mEq/L (ref 19–32)
GFR: 32.5 mL/min — ABNORMAL LOW (ref >60.00)
Glucose, Bld: 129 mg/dL — ABNORMAL HIGH (ref 70–99)
Potassium: 4.4 mEq/L (ref 3.5–5.1)
Sodium: 138 mEq/L (ref 135–145)

## 2011-08-12 LAB — CBC WITH DIFFERENTIAL/PLATELET
Basophils Absolute: 0 10*3/uL (ref 0.0–0.1)
Basophils Relative: 0.3 % (ref 0.0–3.0)
HCT: 34.9 % — ABNORMAL LOW (ref 36.0–46.0)
Hemoglobin: 11.5 g/dL — ABNORMAL LOW (ref 12.0–15.0)
Lymphs Abs: 1.6 10*3/uL (ref 0.7–4.0)
MCHC: 32.9 g/dL (ref 30.0–36.0)
Monocytes Relative: 4.6 % (ref 3.0–12.0)
Neutro Abs: 5 10*3/uL (ref 1.4–7.7)
RBC: 3.55 Mil/uL — ABNORMAL LOW (ref 3.87–5.11)
RDW: 15.2 % — ABNORMAL HIGH (ref 11.5–14.6)

## 2011-08-12 LAB — TSH: TSH: 2.25 u[IU]/mL (ref 0.35–5.50)

## 2011-08-12 NOTE — Assessment & Plan Note (Signed)
Acutely worsened Now being evaluated by nephrologist

## 2011-08-12 NOTE — Assessment & Plan Note (Signed)
Seems regular on the amiodarone

## 2011-08-12 NOTE — Progress Notes (Signed)
Subjective:    Patient ID: Carla Mendez, female    DOB: [birth year], 78 y.o.   MRN: [id number]  HPI Here with caregiver Recent visit with Dr Marcille Blanco pending Ultrasound planned  Having some right sided pain now --usually on left though Hurts at times--especially if she moves around Hasn't been using the tramadol  Concerned about blood pressure Gets dizziness upon standing---needs someone there to keep her from falling Occ dizziness when just sitting Only for past 3 days Wonders about sinus problems because she feels pressure No chest pain occ coughing  Still home Has help from 9PM--2PM Son has had help in 2-9PM period in last few days also due to the dizziness No syncope No vertigo  Current Outpatient Prescriptions on File Prior to Visit  Medication Sig Dispense Refill  . amiodarone (PACERONE) 200 MG tablet Take 1 tablet by mouth Mon-Fri and 1/2 tablet Sat and Sun  30 tablet  6  . cholecalciferol (VITAMIN D) 1000 UNITS tablet Take 1,000 Units by mouth daily.      Marland Kitchen lisinopril (PRINIVIL,ZESTRIL) 20 MG tablet Take 1 tablet (20 mg total) by mouth daily. Held as of 07/28/11      . meclizine (ANTIVERT) 25 MG tablet Take 1 tablet (25 mg total) by mouth 3 (three) times daily as needed.  30 tablet  11  . nystatin (MYCOSTATIN) powder Apply topically 2 (two) times daily as needed.  30 g  1  . pantoprazole (PROTONIX) 40 MG tablet Take 1 tablet (40 mg total) by mouth daily.  30 tablet  11  . traMADol (ULTRAM) 50 MG tablet Take 1-2 tablets (50-100 mg total) by mouth 3 (three) times daily as needed.  100 tablet  0  . triamcinolone (KENALOG) 0.1 % cream Apply 1 application topically 2 (two) times daily.          Allergies  Allergen Reactions  . Ibuprofen     REACTION: swelling  . Meloxicam Swelling    Past Medical History  Diagnosis Date  . Allergy   . Atrial fibrillation   . GERD (gastroesophageal reflux disease)   . Hypertension   . Osteoporosis     osteopenia,  osteoarthritis  . Renal insufficiency   . Allergic rhinitis   . Psoriasis     Past Surgical History  Procedure Date  . Cholecystectomy   . Abdominal hysterectomy   . Appendicitis   . Esophageal dilation   . Basal cell carcinoma excision 2/13    Moh's surgery at [facility]--on face    Family History  Problem Relation Age of Onset  . Heart disease Mother   . Cancer Father     kidney  . Heart disease Father   . Hypertension Brother   . Heart disease Brother   . Breast cancer Neg Hx   . Colon cancer Neg Hx     History   Social History  . Marital Status: Married    Spouse Name: N/A    Number of Children: 4  . Years of Education: N/A   Occupational History  .     Social History Main Topics  . Smoking status: Never Smoker   . Smokeless tobacco: Never Used  . Alcohol Use: No  . Drug Use: No  . Sexually Active: Not on file   Other Topics Concern  . Not on file   Social History Narrative  . No narrative on file   Review of Systems No fever Occ SOB---not recently    Objective:  Physical Exam  Constitutional: No distress.       frail  Eyes: EOM are normal.       No nystagmus   Neck: No thyromegaly present.  Cardiovascular: Normal rate, regular rhythm and normal heart sounds.  Exam reveals no gallop.   No murmur heard. Pulmonary/Chest: Effort normal and breath sounds normal. No respiratory distress. She has no wheezes. She has no rales.  Musculoskeletal: She exhibits no edema.  Lymphadenopathy:    She has no cervical adenopathy.  Neurological:       Mild confusion No focal weakness Able to get up with assist---not clearly orthostatic from sit to stand. Full weight bearing and took a couple of steps  Psychiatric: She has a normal mood and affect. Her behavior is normal.          Assessment & Plan:

## 2011-08-12 NOTE — Assessment & Plan Note (Signed)
BP Readings from Last 3 Encounters:  08/12/11 130/70  07/27/11 140/62  02/24/11 126/72   Will cut back on lisinopril Consider stopping--though that may depend on nephrology eval

## 2011-08-12 NOTE — Patient Instructions (Signed)
Please decrease the lisinopril to 1/2 tab (10mg ) daily

## 2011-08-12 NOTE — Assessment & Plan Note (Signed)
Has had both orthostatic hypotension as well as vestibular dizziness in past Doesn't seem vestibular now No real sinus findings on exam--I don't think she has infection Most likely some orthostasis as that is mostly what she is telling in history  Will recheck labs---esp thyroid on the amiodarone Decrease lisinopril dose

## 2011-08-17 ENCOUNTER — Encounter: Payer: Self-pay | Admitting: *Deleted

## 2011-08-19 ENCOUNTER — Telehealth: Payer: Self-pay

## 2011-08-19 NOTE — Telephone Encounter (Signed)
Pt  Seen 08/12/11 wants to know if she is to stop taking the Lisinopril. If pt is to take Lisinopril 10 mg daily pt request new rx for Lisinopril 10 mg sent to [facility] pharmacy due to difficulty cutting med. Pt is presently taking Lisinopril 20 mg taking 1/2 tab daily.[place] pharmacy.Please advise.

## 2011-08-20 MED ORDER — LISINOPRIL 10 MG PO TABS: 10.0000 mg | ORAL_TABLET | Freq: Every day | ORAL | Status: DC

## 2011-08-20 NOTE — Telephone Encounter (Signed)
Go ahead and send the new Rx for lisinopril 10mg  daily 1 year Rx

## 2011-08-20 NOTE — Telephone Encounter (Signed)
Spoke with patient and advised results rx sent to pharmacy by e-script  

## 2011-09-01 ENCOUNTER — Telehealth: Payer: Self-pay | Admitting: Internal Medicine

## 2011-09-01 NOTE — Telephone Encounter (Signed)
Phone call from Dr Cherylann Ratel Is seeing her now BP up with systolic to 180 No clear change in the dizziness  Will increase the lisinopril to 20mg  daily

## 2011-09-07 ENCOUNTER — Telehealth: Payer: Self-pay

## 2011-09-07 NOTE — Telephone Encounter (Signed)
Pt left v/m Friday morning BP 180 and after taking med systolic was 155. BP high over weekend. Today BP systolic 190 then took again 9:15 am was 155/74. Pt wants to know if BP med should be changed. Pt also called about refills of meds but did not list meds to be refilled. Left v/m for pt to call back.

## 2011-09-07 NOTE — Telephone Encounter (Signed)
Spoke to patient and she feels heavy headed?? BP up to 225/107 p 68 per pt. I asked if she was taking lisinopril 20 mg daily and she stated no, that Dr.Letvak told her to take 1/2 tab. I advised pt that on 09/01/11 ( see phone note) Dr.Lateef suggested she take 20 mg daily and that Dr.Letvak knew about this. Please advise if this is correct.

## 2011-09-07 NOTE — Telephone Encounter (Signed)
Caller: Jocie/Patient; PCP: Tillman Abide; CB#: [phone]; ; ; Call regarding dizziness.  Has history of Hypertension.  States having vertigo.  Recently increased lisinopril to 20mg  daily, has been taking this as directed since increased, except she forgot and took 1/2 tab on Saturday and Sunday 09/05/11 and 09/06/11.   Onset of dizziness 08/05/11 but has not resolved well.  Meclezine is helpful usually, but had increased dizziness 09/05/11.  States Blood Pressure 09/07/11  0700 185/92 and on recheck at 185/77 0830, and  142/72 1030.  Currently states she feels the room is spinning, and she is afraid she is "staggering" and she holds on to the wall for support to walk.  Per protocol, advised being seen within 4 hours; states she is unable to come to the office, as her caregiver has left for the day.   Appointments not available within 4 hours per Epic; info to office for provider/staff review/followup. MAY REACH PATIENT AT [phone].

## 2011-09-07 NOTE — Telephone Encounter (Signed)
Please see new note from [facility], pt didn't say anything about dizziness when I spoke with her, she just stated her head felt heavy. Please advise

## 2011-09-07 NOTE — Telephone Encounter (Signed)
Will see then. 

## 2011-09-07 NOTE — Telephone Encounter (Signed)
Please make sure that she is taking lisinopril 20 mg a day.  Any other changes, does she feel OK? Speak to Dr. Patsy Lager before calling her.  Make sure she has f/u with Dr. Alphonsus Sias later in the week, too.

## 2011-09-07 NOTE — Telephone Encounter (Signed)
Spoke with patient and scheduled appt at 9:15 am with Dr.G, pt has to arrange travel and can't come earlier. Pt doesn't have any slurred speech, or facial drooping.   Will forward note to Dr.G

## 2011-09-07 NOTE — Telephone Encounter (Signed)
Trying to call patient, she called and was on hold and hung up, all phone lines are giving a busy signal.  Pt calling stating her BP is up to 225, per Lashawn ( front desk).

## 2011-09-07 NOTE — Telephone Encounter (Signed)
Discussed case and dispo with [facility];  If neurological symptoms, to ER  If seems stable, first AM appt to eval.

## 2011-09-08 ENCOUNTER — Encounter: Payer: Self-pay | Admitting: Family Medicine

## 2011-09-08 ENCOUNTER — Encounter: Payer: Medicare Other | Admitting: Cardiology

## 2011-09-08 ENCOUNTER — Telehealth: Payer: Self-pay

## 2011-09-08 ENCOUNTER — Ambulatory Visit (INDEPENDENT_AMBULATORY_CARE_PROVIDER_SITE_OTHER): Payer: Medicare Other | Admitting: Family Medicine

## 2011-09-08 VITALS — BP 134/72 | HR 84 | Temp 97.6°F | Wt 142.5 lb

## 2011-09-08 DIAGNOSIS — I10 Essential (primary) hypertension: Secondary | ICD-10-CM

## 2011-09-08 DIAGNOSIS — R3915 Urgency of urination: Secondary | ICD-10-CM | POA: Insufficient documentation

## 2011-09-08 DIAGNOSIS — R42 Dizziness and giddiness: Secondary | ICD-10-CM

## 2011-09-08 LAB — POCT URINALYSIS DIPSTICK
Nitrite, UA: POSITIVE
Protein, UA: NEGATIVE
Spec Grav, UA: 1.015
Urobilinogen, UA: 0.2
pH, UA: 6

## 2011-09-08 MED ORDER — LISINOPRIL 20 MG PO TABS: 20.0000 mg | ORAL_TABLET | Freq: Every day | ORAL | Status: DC

## 2011-09-08 NOTE — Patient Instructions (Addendum)
I think you did have vertigo episode last week.  May continue meclizine as needed.  This seems to be improving. Your blood pressures are running high.  Increase lisinopril 10mg  to 2 pills daily until new prescription picked up.  This will be for 20mg  daily so just one pill. Return to see Dr. Alphonsus Sias in 1 month for follow up.

## 2011-09-08 NOTE — Telephone Encounter (Signed)
Want to increase lisinopril to 20mg  daily - have asked pt to take 2 pills of 10mg  until runs out.

## 2011-09-08 NOTE — Progress Notes (Signed)
  Subjective:    Patient ID: Carla Mendez, female    DOB: 28-Nov-1923, 78 y.o.   MRN: [id number]  HPI CC: labile blood pressures  Presents alone, caregiver in waiting room.  78 yo new to me pt of Dr. Karle Starch resident of [facility] presents with 1 wk h/o elevated blood pressures.  Started 1 week ago, when awoke in middle of night to go to bathroom, describes episode of vertigo associated with nausea.  This lasted several seconds, but for the rest of the day and several days thereafter felt like she was dysequilibrated/staggering.  Did not go to church 2/2 imbalance.  No presyncope or LOC.  Recently lisinopril decreased to 10 mg daily 2/2 concern for orthostatic dizziness.  Actually, as of latest phone note, lisinopril was again increased to 20mg  daily 2/2 hypertension, but pt states has not been taking 20mg .  Last month's renal note (Dr. Cherylann Ratel) as well as recent Dr. Alphonsus Sias note reviewed - acute on chronic stage 4 kidney disease, saw Dr. Cherylann Ratel on Tuesday.  H/o inner ear (vestibular dizziness) and h/o orthostatic dizziness.  Takes meclizine for this, does help.  Brings log of blood pressures over the last few days ranging from 130-185/70-80s, HR 60-70s, yesterday had blood pressure reading up to 225 systolic.  Uses personal blood pressure cuff at home.  Has had some increased frequency and urinary urgency but denies dysuria, hematuria, abd pain.  Past Medical History  Diagnosis Date  . Allergy   . Atrial fibrillation   . GERD (gastroesophageal reflux disease)   . Hypertension   . Osteoporosis     osteopenia, osteoarthritis  . Renal insufficiency   . Allergic rhinitis   . Psoriasis      Review of Systems No fevers, no pain anywhere, denies chest pain/tightness, SOB, leg swelling, palpitations.  Denies unilateral weakness, slurred speech.  Minimal cough.  No dysuria.  Has had mild pressure headache which she attributes to sinus congestion.    Objective:   Physical Exam  Nursing  note and vitals reviewed. Constitutional: She is oriented to person, place, and time. She appears well-developed and well-nourished. No distress.  HENT:  Head: Normocephalic and atraumatic.  Mouth/Throat: Oropharynx is clear and moist. No oropharyngeal exudate.  Eyes: Conjunctivae and EOM are normal. Pupils are equal, round, and reactive to light.  Neck: Normal range of motion. Neck supple.  Cardiovascular: Normal rate, regular rhythm, normal heart sounds and intact distal pulses.   No murmur heard. Pulmonary/Chest: Effort normal and breath sounds normal. No respiratory distress. She has no wheezes. She has no rales.  Musculoskeletal: She exhibits no edema.  Lymphadenopathy:    She has no cervical adenopathy.  Neurological: She is alert and oriented to person, place, and time. She has normal strength. No cranial nerve deficit or sensory deficit. She displays a negative Romberg sign.       CN 2-12 intact except hearing. FTN normal No pronator drift. Slowed gait, able to stand unassisted but very slow. No significant nystagmus noted  Skin: Skin is warm and dry. No rash noted.       Assessment & Plan:

## 2011-09-08 NOTE — Telephone Encounter (Signed)
Pharmacy notified.

## 2011-09-08 NOTE — Assessment & Plan Note (Signed)
Recent elevated readings at home in setting of decreasing lisinopril last month to 10mg . Will increase lisinopril back to 20mg  daily. BP Readings from Last 3 Encounters:  09/08/11 134/72  08/12/11 130/70  07/27/11 140/62

## 2011-09-08 NOTE — Assessment & Plan Note (Addendum)
Checked UA for UTI which could account for some sxs she describes today - however UA/micro today overall negative for infection.  UCx pending.

## 2011-09-08 NOTE — Telephone Encounter (Signed)
[place] pharmacy left v/m received e script for lisinopril 20 mg taking one daily. Filled 09/07/11 lisinopril 10 mg sent by Dr Alphonsus Sias on 08/20/11 taking one daily. Please verify what pt should be taking.Please advise.

## 2011-09-08 NOTE — Assessment & Plan Note (Addendum)
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   Recommended continue meclizine for now.   Will monitor as she seems to be improving.

## 2011-09-10 LAB — URINE CULTURE

## 2011-09-14 ENCOUNTER — Telehealth: Payer: Self-pay

## 2011-09-14 ENCOUNTER — Ambulatory Visit: Payer: Medicare Other | Admitting: Family Medicine

## 2011-09-14 NOTE — Telephone Encounter (Signed)
Carla Mendez called to confirm the results of urine culture and verify how pt to take lisinopril. Carla Mendez advised as instructed per urine culture result note and 09/08/11 phone note.

## 2011-10-02 ENCOUNTER — Other Ambulatory Visit: Payer: Self-pay | Admitting: *Deleted

## 2011-10-02 NOTE — Telephone Encounter (Signed)
Okay to fill 1 month supply x 1 Let her know that this can affect BP and kidney function---she should use the lowest effective dose--not 4 times a day regularly

## 2011-10-02 NOTE — Telephone Encounter (Signed)
Pt.notified

## 2011-10-02 NOTE — Telephone Encounter (Signed)
.  left message to have patient return my call.  

## 2011-10-02 NOTE — Telephone Encounter (Signed)
Faxed request asking for VOLTAREN GEL 1% apply as directed 4 times daily as needed for pain, last filled 09/20/2009, med not on active or non-active med list. Please advise

## 2011-10-09 DIAGNOSIS — Z0279 Encounter for issue of other medical certificate: Secondary | ICD-10-CM

## 2011-11-11 ENCOUNTER — Ambulatory Visit: Payer: Medicare Other | Admitting: Internal Medicine

## 2011-11-11 DIAGNOSIS — Z0289 Encounter for other administrative examinations: Secondary | ICD-10-CM

## 2011-12-05 ENCOUNTER — Other Ambulatory Visit: Payer: Self-pay

## 2011-12-11 ENCOUNTER — Ambulatory Visit (INDEPENDENT_AMBULATORY_CARE_PROVIDER_SITE_OTHER): Payer: Medicare Other | Admitting: Internal Medicine

## 2011-12-11 ENCOUNTER — Encounter: Payer: Self-pay | Admitting: Internal Medicine

## 2011-12-11 VITALS — BP 158/70 | HR 77 | Temp 97.7°F | Wt 142.0 lb

## 2011-12-11 DIAGNOSIS — S40929A Unspecified superficial injury of unspecified upper arm, initial encounter: Secondary | ICD-10-CM

## 2011-12-11 DIAGNOSIS — S40919A Unspecified superficial injury of unspecified shoulder, initial encounter: Secondary | ICD-10-CM

## 2011-12-11 MED ORDER — SILVER SULFADIAZINE 1 % EX CREA: TOPICAL_CREAM | Freq: Every day | CUTANEOUS | Status: DC

## 2011-12-11 NOTE — Assessment & Plan Note (Signed)
Non viable tissue removed with forceps and scissors Dressed with silvadene and gauze Will have RN continue every other day No infection at this point

## 2011-12-11 NOTE — Progress Notes (Signed)
Subjective:    Patient ID: Carla Mendez, female    DOB: 17-Sep-1923, 78 y.o.   MRN: [id number]  HPI Here with Barbara---aide Now has 24 hour aides since falling last weekend Prior had daily 8-4 and overnight  7 days ago, had finished putting on pants, was going to sit down to put on shoes Not sure what happened but she fell to floor Skin tear on left arm Scattered bruising with soreness on trunk Trouble getting up so called 911  Eating okay  Current Outpatient Prescriptions on File Prior to Visit  Medication Sig Dispense Refill  . amiodarone (PACERONE) 200 MG tablet Take 1 tablet by mouth Mon-Fri and 1/2 tablet Sat and Sun  30 tablet  6  . cholecalciferol (VITAMIN D) 1000 UNITS tablet Take 1,000 Units by mouth daily.      . diclofenac sodium (VOLTAREN) 1 % GEL Apply 2 g topically 4 (four) times daily.      . furosemide (LASIX) 20 MG tablet Take 20 mg by mouth daily as needed.      Marland Kitchen lisinopril (PRINIVIL,ZESTRIL) 20 MG tablet Take 1 tablet (20 mg total) by mouth daily.  30 tablet  11  . meclizine (ANTIVERT) 25 MG tablet Take 1 tablet (25 mg total) by mouth 3 (three) times daily as needed.  30 tablet  11  . nystatin (MYCOSTATIN) powder Apply topically 2 (two) times daily as needed.  30 g  1  . pantoprazole (PROTONIX) 40 MG tablet Take 1 tablet (40 mg total) by mouth daily.  30 tablet  11  . traMADol (ULTRAM) 50 MG tablet Take 1-2 tablets (50-100 mg total) by mouth 3 (three) times daily as needed.  100 tablet  0    Allergies  Allergen Reactions  . Ibuprofen     REACTION: swelling  . Meloxicam Swelling    Past Medical History  Diagnosis Date  . Allergy   . Atrial fibrillation   . GERD (gastroesophageal reflux disease)   . Hypertension   . Osteoporosis     osteopenia, osteoarthritis  . Renal insufficiency   . Allergic rhinitis   . Psoriasis     Past Surgical History  Procedure Date  . Cholecystectomy   . Abdominal hysterectomy   . Appendicitis   . Esophageal  dilation   . Basal cell carcinoma excision 2/13    Moh's surgery at [facility]--on face    Family History  Problem Relation Age of Onset  . Heart disease Mother   . Cancer Father     kidney  . Heart disease Father   . Hypertension Brother   . Heart disease Brother   . Breast cancer Neg Hx   . Colon cancer Neg Hx     History   Social History  . Marital Status: Widowed    Spouse Name: N/A    Number of Children: 4  . Years of Education: N/A   Occupational History  .     Social History Main Topics  . Smoking status: Never Smoker   . Smokeless tobacco: Never Used  . Alcohol Use: No  . Drug Use: No  . Sexually Active: Not on file   Other Topics Concern  . Not on file   Social History Narrative   Now at [employer]-- independent unit at [facility]   Review of Systems No syncope No chest pain No SOB     Objective:   Physical Exam  Constitutional: She appears well-developed and well-nourished. No distress.  Skin:       Large skin tear on extensor distal left arm Some skin may be viable Non viable skin removed from open area Dressed with silvadene and gauze          Assessment & Plan:

## 2011-12-11 NOTE — Patient Instructions (Signed)
Please dress the left arm wound with silvadene and gauze every 2 days

## 2011-12-17 ENCOUNTER — Telehealth: Payer: Self-pay

## 2011-12-17 NOTE — Telephone Encounter (Signed)
Pt had fall 2 weeks ago; saw Dr Alphonsus Sias 12/11/11; on 12/15/12 pain started at right shoulder blade on and off. Pain worse when takes breath or upon movement. Pt also started non productive cough yesterday ; no fever and not SOB.pt wants to know if could get an xray or does need to be rechecked.Please advise.

## 2011-12-17 NOTE — Telephone Encounter (Signed)
If she is not better by early next week (or tomorrow if she prefers), can set up reevaluation with me or Dr Patsy Lager. We can do x-ray then if appropriate

## 2011-12-21 NOTE — Telephone Encounter (Signed)
Has appt scheduled 12/25/11.

## 2011-12-22 DIAGNOSIS — Z9181 History of falling: Secondary | ICD-10-CM

## 2011-12-22 DIAGNOSIS — I10 Essential (primary) hypertension: Secondary | ICD-10-CM

## 2011-12-22 DIAGNOSIS — S50919A Unspecified superficial injury of unspecified forearm, initial encounter: Secondary | ICD-10-CM

## 2011-12-22 DIAGNOSIS — S50909A Unspecified superficial injury of unspecified elbow, initial encounter: Secondary | ICD-10-CM

## 2011-12-22 DIAGNOSIS — S60919A Unspecified superficial injury of unspecified wrist, initial encounter: Secondary | ICD-10-CM

## 2011-12-22 DIAGNOSIS — K5900 Constipation, unspecified: Secondary | ICD-10-CM

## 2011-12-25 ENCOUNTER — Ambulatory Visit (INDEPENDENT_AMBULATORY_CARE_PROVIDER_SITE_OTHER): Payer: Medicare Other | Admitting: Internal Medicine

## 2011-12-25 ENCOUNTER — Encounter: Payer: Self-pay | Admitting: Internal Medicine

## 2011-12-25 VITALS — BP 120/60 | HR 69 | Temp 98.3°F | Wt 146.0 lb

## 2011-12-25 DIAGNOSIS — S40929A Unspecified superficial injury of unspecified upper arm, initial encounter: Secondary | ICD-10-CM

## 2011-12-25 NOTE — Progress Notes (Signed)
Subjective:    Patient ID: Carla Mendez, female    DOB: 1923-07-09, 78 y.o.   MRN: [id number]  HPI Here with aide  Ongoing concern about left shoulder Felt it was just bruising but pain persists Grabbing pain Seems to be improving though  No sig cough---only intermittent Gets voice fatigue No SOB  Current Outpatient Prescriptions on File Prior to Visit  Medication Sig Dispense Refill  . amiodarone (PACERONE) 200 MG tablet Take 1 tablet by mouth Mon-Fri and 1/2 tablet Sat and Sun  30 tablet  6  . cholecalciferol (VITAMIN D) 1000 UNITS tablet Take 1,000 Units by mouth daily.      . diclofenac sodium (VOLTAREN) 1 % GEL Apply 2 g topically 4 (four) times daily.      . furosemide (LASIX) 20 MG tablet Take 20 mg by mouth daily as needed.      Marland Kitchen lisinopril (PRINIVIL,ZESTRIL) 20 MG tablet Take 1 tablet (20 mg total) by mouth daily.  30 tablet  11  . meclizine (ANTIVERT) 25 MG tablet Take 1 tablet (25 mg total) by mouth 3 (three) times daily as needed.  30 tablet  11  . nystatin (MYCOSTATIN) powder Apply topically 2 (two) times daily as needed.  30 g  1  . pantoprazole (PROTONIX) 40 MG tablet Take 1 tablet (40 mg total) by mouth daily.  30 tablet  11  . silver sulfADIAZINE (SILVADENE) 1 % cream Apply topically daily.  50 g  0  . traMADol (ULTRAM) 50 MG tablet Take 1-2 tablets (50-100 mg total) by mouth 3 (three) times daily as needed.  100 tablet  0    Allergies  Allergen Reactions  . Ibuprofen     REACTION: swelling  . Meloxicam Swelling    Past Medical History  Diagnosis Date  . Allergy   . Atrial fibrillation   . GERD (gastroesophageal reflux disease)   . Hypertension   . Osteoporosis     osteopenia, osteoarthritis  . Renal insufficiency   . Allergic rhinitis   . Psoriasis     Past Surgical History  Procedure Date  . Cholecystectomy   . Abdominal hysterectomy   . Appendicitis   . Esophageal dilation   . Basal cell carcinoma excision 2/13    Moh's surgery at  [facility]--on face    Family History  Problem Relation Age of Onset  . Heart disease Mother   . Cancer Father     kidney  . Heart disease Father   . Hypertension Brother   . Heart disease Brother   . Breast cancer Neg Hx   . Colon cancer Neg Hx     History   Social History  . Marital Status: Widowed    Spouse Name: N/A    Number of Children: 4  . Years of Education: N/A   Occupational History  .     Social History Main Topics  . Smoking status: Never Smoker   . Smokeless tobacco: Never Used  . Alcohol Use: No  . Drug Use: No  . Sexually Active: Not on file   Other Topics Concern  . Not on file   Social History Narrative   Now at [employer]-- independent unit at [facility]   Review of Systems No fever Appetite is big Weight is up 3#    Objective:   Physical Exam  Pulmonary/Chest: Effort normal and breath sounds normal. No respiratory distress. She has no wheezes. She has no rales.  Musculoskeletal:  Mild restriction of abduction of both shoulders Slight scapular tenderness bilaterally  Skin:       Left arm and forearm wounds are well granulated No inflammation          Assessment & Plan:

## 2011-12-25 NOTE — Assessment & Plan Note (Signed)
Wounds are healed Doesn't need dressings anymore Mild residual shoulder problems from bruising---but clearly better No further visits needed

## 2012-02-05 ENCOUNTER — Other Ambulatory Visit: Payer: Self-pay

## 2012-02-05 DIAGNOSIS — I4891 Unspecified atrial fibrillation: Secondary | ICD-10-CM

## 2012-02-05 MED ORDER — AMIODARONE HCL 200 MG PO TABS: ORAL_TABLET | ORAL | Status: DC

## 2012-02-09 ENCOUNTER — Telehealth: Payer: Self-pay

## 2012-02-09 NOTE — Telephone Encounter (Signed)
Dr.Letvak I have went as far back as 2008 and don't see a lipid panel, is this correct?

## 2012-02-09 NOTE — Telephone Encounter (Signed)
Angel at [facility] left v/m requesting last lipid panel faxed to [phone]. I do not see recent lipid panel.Please advise.

## 2012-02-10 NOTE — Telephone Encounter (Signed)
Left message with Lawanna Kobus with results, advised her to call her if patient needs to have lipid done here.

## 2012-02-10 NOTE — Telephone Encounter (Signed)
I probably haven't checked since I wasn't going to put her on medication. Let them know, if they need to , they can add it to their next blood work

## 2012-02-12 ENCOUNTER — Other Ambulatory Visit (HOSPITAL_COMMUNITY): Payer: Self-pay | Admitting: Cardiology

## 2012-02-12 DIAGNOSIS — I4891 Unspecified atrial fibrillation: Secondary | ICD-10-CM

## 2012-02-17 ENCOUNTER — Ambulatory Visit (HOSPITAL_COMMUNITY)
Admission: RE | Admit: 2012-02-17 | Discharge: 2012-02-17 | Disposition: A | Discharge status: Home / Self Care | Payer: Medicare Other | Source: Ambulatory Visit | Attending: Cardiology | Admitting: Cardiology

## 2012-02-17 DIAGNOSIS — I4891 Unspecified atrial fibrillation: Secondary | ICD-10-CM | POA: Insufficient documentation

## 2012-02-17 DIAGNOSIS — Z79899 Other long term (current) drug therapy: Secondary | ICD-10-CM | POA: Insufficient documentation

## 2012-02-26 ENCOUNTER — Telehealth: Payer: Self-pay | Admitting: Internal Medicine

## 2012-02-26 NOTE — Telephone Encounter (Signed)
I can't do the face to face visit without seeing her in the office---and home health can't come out without it. It is a Catch-22 Since she was seen in the ER, technically they could do the form but I am not sure they will  Can check with the hospital about it, but otherwise, she will need to come in to have staples removed. BTW--it is quite simple to remove staples if the son wants to try on his own (needs specific staple removing tool though)

## 2012-02-26 NOTE — Telephone Encounter (Signed)
Called son back to tell him what Dr Alphonsus Sias said and he will contact [facility] as to what the best plan is for his mother. He will call our office to make an appt if he is going to have her come here for staple removal.

## 2012-02-26 NOTE — Telephone Encounter (Signed)
Patient fell last Thurs on 02/18/12 hit her head and also cracked a rib. She went to [facility] ER and they put 2 staples in her head and told her she could come to PCP or get Home Health like [facility] to get nursing out there to asses and remove the staples. The family doesn't want to have to bring her out in the cold right now. Could you order HH to do this w/o seeing her face to face in the office? [facility] orders in your in box? Sons name is Amada Jupiter and please call him to let him know easiest thing for them to get the staples out.

## 2012-02-29 ENCOUNTER — Telehealth: Payer: Self-pay | Admitting: Internal Medicine

## 2012-02-29 NOTE — Telephone Encounter (Signed)
Patient fell and went to ER at [facility] on 02/18/12, Staples put in head and also had a cracked rib. [facility] will go out to assess patient and to remove staples in her head. Dr Alphonsus Sias will do a Face to Face appt with the patient on 03/22/12 at 12pm and will fill out the Face to Face encounter at that time.

## 2012-03-04 DIAGNOSIS — K5900 Constipation, unspecified: Secondary | ICD-10-CM

## 2012-03-04 DIAGNOSIS — Z4802 Encounter for removal of sutures: Secondary | ICD-10-CM

## 2012-03-04 DIAGNOSIS — I4891 Unspecified atrial fibrillation: Secondary | ICD-10-CM

## 2012-03-04 DIAGNOSIS — IMO0002 Reserved for concepts with insufficient information to code with codable children: Secondary | ICD-10-CM

## 2012-03-07 DIAGNOSIS — K5900 Constipation, unspecified: Secondary | ICD-10-CM

## 2012-03-07 DIAGNOSIS — Z4802 Encounter for removal of sutures: Secondary | ICD-10-CM

## 2012-03-07 DIAGNOSIS — I4891 Unspecified atrial fibrillation: Secondary | ICD-10-CM

## 2012-03-07 DIAGNOSIS — IMO0002 Reserved for concepts with insufficient information to code with codable children: Secondary | ICD-10-CM

## 2012-03-10 DIAGNOSIS — Z4802 Encounter for removal of sutures: Secondary | ICD-10-CM

## 2012-03-10 DIAGNOSIS — I4891 Unspecified atrial fibrillation: Secondary | ICD-10-CM

## 2012-03-10 DIAGNOSIS — K5900 Constipation, unspecified: Secondary | ICD-10-CM

## 2012-03-10 DIAGNOSIS — IMO0002 Reserved for concepts with insufficient information to code with codable children: Secondary | ICD-10-CM

## 2012-03-22 ENCOUNTER — Ambulatory Visit: Payer: Medicare Other | Admitting: Internal Medicine

## 2012-03-22 DIAGNOSIS — Z0289 Encounter for other administrative examinations: Secondary | ICD-10-CM

## 2012-03-25 ENCOUNTER — Other Ambulatory Visit: Payer: Self-pay

## 2012-03-25 MED ORDER — TRIAMCINOLONE ACETONIDE 0.1 % EX CREA: 1.0000 "application " | TOPICAL_CREAM | Freq: Two times a day (BID) | CUTANEOUS | Status: DC

## 2012-03-25 NOTE — Telephone Encounter (Signed)
rx sent to pharmacy by e-script  

## 2012-03-25 NOTE — Telephone Encounter (Signed)
Pt request refill Triamcinolone cream 0.1% to [facility]. Dermatologist usually refills for pt's psoriasis.Please advise.

## 2012-03-25 NOTE — Telephone Encounter (Signed)
Okay #30gm x 1 

## 2012-04-20 ENCOUNTER — Ambulatory Visit (INDEPENDENT_AMBULATORY_CARE_PROVIDER_SITE_OTHER): Payer: Medicare Other | Admitting: Internal Medicine

## 2012-04-20 ENCOUNTER — Encounter: Payer: Self-pay | Admitting: Internal Medicine

## 2012-04-20 VITALS — BP 140/70 | HR 66 | Temp 98.0°F | Wt 146.0 lb

## 2012-04-20 DIAGNOSIS — K219 Gastro-esophageal reflux disease without esophagitis: Secondary | ICD-10-CM

## 2012-04-20 DIAGNOSIS — I10 Essential (primary) hypertension: Secondary | ICD-10-CM

## 2012-04-20 DIAGNOSIS — N259 Disorder resulting from impaired renal tubular function, unspecified: Secondary | ICD-10-CM

## 2012-04-20 DIAGNOSIS — M5137 Other intervertebral disc degeneration, lumbosacral region: Secondary | ICD-10-CM

## 2012-04-20 DIAGNOSIS — I4891 Unspecified atrial fibrillation: Secondary | ICD-10-CM

## 2012-04-20 DIAGNOSIS — Z1331 Encounter for screening for depression: Secondary | ICD-10-CM

## 2012-04-20 NOTE — Assessment & Plan Note (Signed)
BP Readings from Last 3 Encounters:  04/20/12 140/70  12/25/11 120/60  12/11/11 158/70   Good control No changes needed

## 2012-04-20 NOTE — Progress Notes (Signed)
  Subjective:    Patient ID: Carla Mendez, female    DOB: October 16, 1923, 79 y.o.   MRN: [id number]  HPI Doing okay Still at [facility] [place] [facility] This is working well for her Uses the rolling walker---caregiver 24 hour care Doesn't drive Another fall last month---better now  Reviewed anticoagulant Rx Not sure why she is not on aspirin---will restart Does feel her heart palpatate at times No chest pain Gets SOB with the palpitation--- may last 5 minutes No DOE otherwise---but fairly limited Occasional dizziness but no syncope (orthostatic--tries to get up very slowly)  Some arthritis pain Only uses voltaren gel if "sore"  Review of Systems Appetite is excellent Weight is stable Sleeps okay for the most part. SOme trouble in past few weeks Has had rash on arms--annoying. No diagnosis from dermatologist Still notes memory problems    Objective:   Physical Exam  Constitutional: She appears well-developed and well-nourished. No distress.  Neck: Normal range of motion. Neck supple. No thyromegaly present.  Cardiovascular: Normal rate, regular rhythm and normal heart sounds.  Exam reveals no gallop.   No murmur heard. Pulmonary/Chest: Effort normal and breath sounds normal. No respiratory distress. She has no wheezes. She has no rales.  Abdominal: Soft. There is no tenderness.  Musculoskeletal: She exhibits no edema.  Lymphadenopathy:    She has no cervical adenopathy.  Psychiatric: She has a normal mood and affect. Her behavior is normal.          Assessment & Plan:

## 2012-04-20 NOTE — Assessment & Plan Note (Signed)
Takes some OTC med for pain---?acetaminophen

## 2012-04-20 NOTE — Assessment & Plan Note (Signed)
Has been quiet  Will have her try without the protonix

## 2012-04-20 NOTE — Assessment & Plan Note (Signed)
Sees Dr Cherylann Ratel

## 2012-04-20 NOTE — Patient Instructions (Addendum)
Please restart a coated aspirin 81mg  daily Please stop the pantoprazole. If you get heartburn or stomach pain, go ahead and restart.

## 2012-04-20 NOTE — Assessment & Plan Note (Signed)
Paced Only brief spells on the amiodarone Will have her restart the 81mg  aspirin

## 2012-05-10 ENCOUNTER — Ambulatory Visit (INDEPENDENT_AMBULATORY_CARE_PROVIDER_SITE_OTHER): Payer: Medicare Other | Admitting: Internal Medicine

## 2012-05-10 ENCOUNTER — Encounter: Payer: Self-pay | Admitting: Internal Medicine

## 2012-05-10 VITALS — BP 120/60 | HR 73 | Temp 98.4°F | Wt 145.0 lb

## 2012-05-10 DIAGNOSIS — I4891 Unspecified atrial fibrillation: Secondary | ICD-10-CM

## 2012-05-10 DIAGNOSIS — I10 Essential (primary) hypertension: Secondary | ICD-10-CM

## 2012-05-10 DIAGNOSIS — Z111 Encounter for screening for respiratory tuberculosis: Secondary | ICD-10-CM

## 2012-05-10 DIAGNOSIS — L408 Other psoriasis: Secondary | ICD-10-CM

## 2012-05-10 DIAGNOSIS — K219 Gastro-esophageal reflux disease without esophagitis: Secondary | ICD-10-CM

## 2012-05-10 LAB — CBC WITH DIFFERENTIAL/PLATELET
Basophils Relative: 1 % (ref 0.0–3.0)
Eosinophils Relative: 2.6 % (ref 0.0–5.0)
Hemoglobin: 11.6 g/dL — ABNORMAL LOW (ref 12.0–15.0)
Lymphocytes Relative: 31.3 % (ref 12.0–46.0)
MCHC: 33.1 g/dL (ref 30.0–36.0)
Monocytes Relative: 5.3 % (ref 3.0–12.0)
Neutro Abs: 4.8 10*3/uL (ref 1.4–7.7)
Neutrophils Relative %: 59.8 % (ref 43.0–77.0)
RBC: 3.81 Mil/uL — ABNORMAL LOW (ref 3.87–5.11)
WBC: 8.1 10*3/uL (ref 4.5–10.5)

## 2012-05-10 LAB — HEPATIC FUNCTION PANEL
ALT: 8 U/L (ref 0–35)
AST: 17 U/L (ref 0–37)
Alkaline Phosphatase: 48 U/L (ref 39–117)
Bilirubin, Direct: 0.1 mg/dL (ref 0.0–0.3)
Total Bilirubin: 0.4 mg/dL (ref 0.3–1.2)
Total Protein: 6.5 g/dL (ref 6.0–8.3)

## 2012-05-10 LAB — BASIC METABOLIC PANEL
CO2: 24 mEq/L (ref 19–32)
Calcium: 8.9 mg/dL (ref 8.4–10.5)
Creatinine, Ser: 1.7 mg/dL — ABNORMAL HIGH (ref 0.4–1.2)
GFR: 31.09 mL/min — ABNORMAL LOW (ref >60.00)
Sodium: 143 mEq/L (ref 135–145)

## 2012-05-10 NOTE — Assessment & Plan Note (Signed)
Has not had problems of late 

## 2012-05-10 NOTE — Progress Notes (Signed)
  Subjective:    Patient ID: Carla Mendez, female    DOB: 12/05/1923, 79 y.o.   MRN: [id number]  HPI Will be moving to the main building at [facility] Has room ready on the first floor Needs FL-2  Has had 24 hour care Needs assist with showering and dressing Independent with bathroom Occ urinary incontinence--uses an attends  No new concerns except awoke with left 4th finger soreness  Current Outpatient Prescriptions on File Prior to Visit  Medication Sig Dispense Refill  . aspirin EC 81 MG tablet Take 81 mg by mouth daily.      . cholecalciferol (VITAMIN D) 1000 UNITS tablet Take 1,000 Units by mouth daily.      Marland Kitchen lisinopril (PRINIVIL,ZESTRIL) 20 MG tablet Take 1 tablet (20 mg total) by mouth daily.  30 tablet  11  . meclizine (ANTIVERT) 25 MG tablet Take 1 tablet (25 mg total) by mouth 3 (three) times daily as needed.  30 tablet  11  . nystatin (MYCOSTATIN) powder Apply topically 2 (two) times daily as needed.  30 g  1   No current facility-administered medications on file prior to visit.    Allergies  Allergen Reactions  . Ibuprofen     REACTION: swelling  . Meloxicam Swelling    Past Medical History  Diagnosis Date  . Allergy   . Atrial fibrillation   . Hypertension   . Osteoporosis     osteopenia, osteoarthritis  . Renal insufficiency   . Allergic rhinitis   . Psoriasis   . GERD (gastroesophageal reflux disease)     Past Surgical History  Procedure Laterality Date  . Cholecystectomy    . Abdominal hysterectomy    . Appendicitis    . Esophageal dilation    . Basal cell carcinoma excision  2/13    Moh's surgery at [facility]--on face    Family History  Problem Relation Age of Onset  . Heart disease Mother   . Cancer Father     kidney  . Heart disease Father   . Hypertension Brother   . Heart disease Brother   . Breast cancer Neg Hx   . Colon cancer Neg Hx     History   Social History  . Marital Status: Widowed    Spouse Name: N/A    Number of  Children: 4  . Years of Education: N/A   Occupational History  .     Social History Main Topics  . Smoking status: Never Smoker   . Smokeless tobacco: Never Used  . Alcohol Use: No  . Drug Use: No  . Sexually Active: Not on file   Other Topics Concern  . Not on file   Social History Narrative   Now at [employer]-- independent unit at [facility]   Review of Systems Bouts of "dehydration" but is better Sleeps okay     Objective:   Physical Exam  Constitutional: She appears well-developed. No distress.  Musculoskeletal:  Mild synovitis and tenderness left 4th PIP  Psychiatric: She has a normal mood and affect. Her behavior is normal.          Assessment & Plan:

## 2012-05-10 NOTE — Assessment & Plan Note (Signed)
BP Readings from Last 3 Encounters:  05/10/12 120/60  04/20/12 140/70  12/25/11 120/60   Good control No changes needed

## 2012-05-10 NOTE — Addendum Note (Signed)
Addended by: Sueanne Margarita on: 05/10/2012 11:38 AM   Modules accepted: Orders

## 2012-05-10 NOTE — Assessment & Plan Note (Signed)
Paced rhythm Asa only due to bleeding risk

## 2012-05-10 NOTE — Assessment & Plan Note (Signed)
Uses TAC prn

## 2012-05-12 LAB — TB SKIN TEST: Induration: 0 mm

## 2012-05-24 ENCOUNTER — Other Ambulatory Visit: Payer: Self-pay | Admitting: Emergency Medicine

## 2012-05-24 MED ORDER — AMIODARONE HCL 200 MG PO TABS: ORAL_TABLET | ORAL | Status: DC

## 2012-06-02 ENCOUNTER — Encounter: Payer: Self-pay | Admitting: Internal Medicine

## 2012-06-02 ENCOUNTER — Non-Acute Institutional Stay: Payer: Medicare Other | Admitting: Internal Medicine

## 2012-06-02 VITALS — BP 122/76 | HR 66 | Resp 22

## 2012-06-02 DIAGNOSIS — L408 Other psoriasis: Secondary | ICD-10-CM

## 2012-06-02 DIAGNOSIS — I10 Essential (primary) hypertension: Secondary | ICD-10-CM

## 2012-06-02 DIAGNOSIS — K219 Gastro-esophageal reflux disease without esophagitis: Secondary | ICD-10-CM

## 2012-06-02 DIAGNOSIS — M19049 Primary osteoarthritis, unspecified hand: Secondary | ICD-10-CM

## 2012-06-02 DIAGNOSIS — I4891 Unspecified atrial fibrillation: Secondary | ICD-10-CM

## 2012-06-02 NOTE — Assessment & Plan Note (Signed)
BP Readings from Last 3 Encounters:  06/02/12 122/76  05/10/12 120/60  04/20/12 140/70   Good control No changes needed

## 2012-06-02 NOTE — Assessment & Plan Note (Signed)
Stomach has been okay  Some gas and occ heartburn spells Generally better after burping No regular meds

## 2012-06-02 NOTE — Progress Notes (Signed)
Subjective:    Patient ID: Carla Mendez, female    DOB: [birth year], 79 y.o.   MRN: [id number]  HPI 1st visit here at [facility] Reviewed status with Linda--clinical coordinator  She has adjusted well Not quite used to the schedule of meals---but okay with it "Its one of the nicest places if you need to be somewhere"  Did have "strange" time with heart--just when getting ready to come over here Twice but no longer than 30 seconds No chest pain Gets DOE--when she walks down the whole hallway No dizziness or syncope  Gets some right flank pain Thinks it is gas---better if she walks Bowels have been fine  Ongoing arthritis  Pain Worst in fingers--has swelling left 4th PIP Thinks she needs something at times---tylenol doesn't help  Current Outpatient Prescriptions on File Prior to Visit  Medication Sig Dispense Refill  . amiodarone (PACERONE) 200 MG tablet Take one (1) tablet by mouth on Monday through Friday, and half (1/2) tablet on Saturday and Sunday  30 tablet  0  . aspirin EC 81 MG tablet Take 81 mg by mouth daily.      . cholecalciferol (VITAMIN D) 1000 UNITS tablet Take 1,000 Units by mouth daily.      Marland Kitchen lisinopril (PRINIVIL,ZESTRIL) 20 MG tablet Take 1 tablet (20 mg total) by mouth daily.  30 tablet  11  . meclizine (ANTIVERT) 25 MG tablet Take 1 tablet (25 mg total) by mouth 3 (three) times daily as needed.  30 tablet  11  . nystatin (MYCOSTATIN) powder Apply topically 2 (two) times daily as needed.  30 g  1  . triamcinolone cream (KENALOG) 0.1 % Apply 1 application topically 3 (three) times daily.        No current facility-administered medications on file prior to visit.    Allergies  Allergen Reactions  . Ibuprofen     REACTION: swelling  . Meloxicam Swelling    Past Medical History  Diagnosis Date  . Allergy   . Atrial fibrillation   . Hypertension   . Osteoporosis     osteopenia, osteoarthritis  . Renal insufficiency   . Allergic rhinitis   . Psoriasis    . GERD (gastroesophageal reflux disease)     Past Surgical History  Procedure Laterality Date  . Cholecystectomy    . Abdominal hysterectomy    . Appendicitis    . Esophageal dilation    . Basal cell carcinoma excision  2/13    Moh's surgery at [facility]--on face    Family History  Problem Relation Age of Onset  . Heart disease Mother   . Cancer Father     kidney  . Heart disease Father   . Hypertension Brother   . Heart disease Brother   . Breast cancer Neg Hx   . Colon cancer Neg Hx     Review of Systems Appetite is fine. Happy with the food Weight is stable Sleeps okay    Objective:   Physical Exam  Constitutional: She appears well-developed and well-nourished. No distress.  Neck: Normal range of motion. No thyromegaly present.  Cardiovascular: Normal rate, regular rhythm and normal heart sounds.  Exam reveals no gallop.   No murmur heard. Pulmonary/Chest: Effort normal and breath sounds normal. No respiratory distress. She has no wheezes. She has no rales.  Abdominal: Soft. There is no tenderness.  Musculoskeletal: She exhibits no edema.  Thickening in hands ---mostly PIP (and DIP)  Lymphadenopathy:    She has no cervical adenopathy.  Neurological:  Slow gait---uses walker No focal weakness  Skin:  Widespread psoriatic plaques  Psychiatric: She has a normal mood and affect. Her behavior is normal.          Assessment & Plan:

## 2012-06-02 NOTE — Assessment & Plan Note (Signed)
Sees derm

## 2012-06-02 NOTE — Assessment & Plan Note (Signed)
More pain Will try tramadol in low dose

## 2012-06-02 NOTE — Assessment & Plan Note (Signed)
2 small spells only on the amiodarone ASA only due to bleeding risk (and has multiple ecchymotic areas on this)

## 2012-06-24 ENCOUNTER — Telehealth: Payer: Self-pay | Admitting: Internal Medicine

## 2012-06-24 NOTE — Telephone Encounter (Signed)
Linda/ Resident Care Coordinator at [facility] phone [phone] called to request a copy of the TB test results.  Transferred to office/Carrie for assistance.

## 2012-06-24 NOTE — Telephone Encounter (Signed)
Weldon Picking sent copy of TB skin test results to [facility].

## 2012-06-30 ENCOUNTER — Telehealth: Payer: Self-pay | Admitting: *Deleted

## 2012-06-30 NOTE — Telephone Encounter (Signed)
Nurse request to change pt from prn Tramadol to Tyl 650 Er prn for back pain. Pt doesn't want to take Tramadol.

## 2012-07-01 ENCOUNTER — Ambulatory Visit (INDEPENDENT_AMBULATORY_CARE_PROVIDER_SITE_OTHER): Payer: Medicare Other | Admitting: *Deleted

## 2012-07-01 DIAGNOSIS — Z111 Encounter for screening for respiratory tuberculosis: Secondary | ICD-10-CM

## 2012-07-05 ENCOUNTER — Telehealth: Payer: Self-pay

## 2012-07-05 NOTE — Telephone Encounter (Signed)
Ordered written to be faxed

## 2012-07-05 NOTE — Telephone Encounter (Signed)
Order faxed.

## 2012-07-05 NOTE — Telephone Encounter (Signed)
Carla Mendez with [facility] left v/m requesting order for U/A due to pt being confused since Fri 07/01/12.Carla Quin said pt has a lot of confusion and thinks pt may have UTI. Carla Quin did not leave contact #.

## 2012-07-06 ENCOUNTER — Other Ambulatory Visit: Payer: Self-pay | Admitting: Internal Medicine

## 2012-07-09 ENCOUNTER — Other Ambulatory Visit: Payer: Self-pay

## 2012-07-12 LAB — URINALYSIS, ROUTINE W REFLEX MICROSCOPIC
Bilirubin, UA: NEGATIVE
Nitrite, UA: NEGATIVE
Protein, UA: NEGATIVE
RBC, UA: NEGATIVE
Specific Gravity, UA: 1.017 (ref 1.005–1.030)
pH, UA: 5.5 (ref 5.0–7.5)

## 2012-07-12 LAB — URINE CULTURE

## 2012-07-12 LAB — SPECIMEN STATUS REPORT

## 2012-07-14 DIAGNOSIS — R404 Transient alteration of awareness: Secondary | ICD-10-CM

## 2012-07-14 DIAGNOSIS — I4891 Unspecified atrial fibrillation: Secondary | ICD-10-CM

## 2012-07-14 DIAGNOSIS — N390 Urinary tract infection, site not specified: Secondary | ICD-10-CM

## 2012-07-14 DIAGNOSIS — S20219A Contusion of unspecified front wall of thorax, initial encounter: Secondary | ICD-10-CM

## 2012-07-20 ENCOUNTER — Telehealth: Payer: Self-pay

## 2012-07-20 DIAGNOSIS — M549 Dorsalgia, unspecified: Secondary | ICD-10-CM

## 2012-07-20 NOTE — Telephone Encounter (Signed)
Please see about getting any records so we can make the referral.  Thanks .

## 2012-07-20 NOTE — Telephone Encounter (Signed)
Carla Mendez left v/m; pt discharged from [facility] with instructions to f/u with Dr Metta Clines for pain management. Dr Metta Clines office is requesting referral from Dr Alphonsus Sias with updated records. Carla Mendez requesting referral to Dr Metta Clines at pain management ASAP.Carla Mendez request cb.

## 2012-07-21 NOTE — Telephone Encounter (Signed)
Faxed [facility] for records.

## 2012-07-21 NOTE — Telephone Encounter (Signed)
Records received and placed in Dr. Ashok Cordia In Box.

## 2012-07-25 ENCOUNTER — Ambulatory Visit: Payer: Medicare Other | Admitting: Internal Medicine

## 2012-07-27 NOTE — Telephone Encounter (Signed)
Appt made with Dr Metta Clines on 08/29/2012 message left for son with appt information.

## 2012-08-18 DIAGNOSIS — K219 Gastro-esophageal reflux disease without esophagitis: Secondary | ICD-10-CM

## 2012-08-18 DIAGNOSIS — S20219A Contusion of unspecified front wall of thorax, initial encounter: Secondary | ICD-10-CM

## 2012-08-18 DIAGNOSIS — I4891 Unspecified atrial fibrillation: Secondary | ICD-10-CM

## 2012-08-18 DIAGNOSIS — I10 Essential (primary) hypertension: Secondary | ICD-10-CM

## 2012-08-26 ENCOUNTER — Telehealth: Payer: Self-pay

## 2012-08-26 NOTE — Telephone Encounter (Signed)
Trey Paula PT with [facility] left v/m requesting verbal orders for PT home health for 2 X a week for 5 weeks.Please advise.

## 2012-08-26 NOTE — Telephone Encounter (Signed)
That is fine 

## 2012-08-26 NOTE — Telephone Encounter (Signed)
Spoke with Trey Paula and advised verbal orders

## 2012-08-26 NOTE — Telephone Encounter (Signed)
Nadine with [facility] left v/m that pts OT home health visit was rescheduled for 08/29/12.

## 2012-08-29 ENCOUNTER — Telehealth: Payer: Self-pay

## 2012-08-29 DIAGNOSIS — I4891 Unspecified atrial fibrillation: Secondary | ICD-10-CM

## 2012-08-29 DIAGNOSIS — IMO0001 Reserved for inherently not codable concepts without codable children: Secondary | ICD-10-CM

## 2012-08-29 DIAGNOSIS — N390 Urinary tract infection, site not specified: Secondary | ICD-10-CM

## 2012-08-29 DIAGNOSIS — I10 Essential (primary) hypertension: Secondary | ICD-10-CM

## 2012-08-29 DIAGNOSIS — Z9181 History of falling: Secondary | ICD-10-CM

## 2012-08-29 NOTE — Telephone Encounter (Signed)
Carla Mendez with OT [facility] left v/m requesting verbal order for OT evaluation this week; pt did not get eval last week.Please advise.

## 2012-08-29 NOTE — Telephone Encounter (Signed)
Okay to proceed with OT evaluation

## 2012-08-30 ENCOUNTER — Telehealth: Payer: Self-pay

## 2012-08-30 NOTE — Telephone Encounter (Signed)
Left message on answering machine with results, advised her to call if any questions

## 2012-08-30 NOTE — Telephone Encounter (Signed)
Jolene with OT [facility] left v/m requesting verbal order for OT for 2 x a week for 6 weeks to help with endurance and confidence and safety issues.Please advise.

## 2012-08-31 NOTE — Telephone Encounter (Signed)
That is fine 

## 2012-08-31 NOTE — Telephone Encounter (Signed)
Left message on voicemail that verbal orders are ok, per Dr. Letvak 

## 2012-09-01 DIAGNOSIS — R262 Difficulty in walking, not elsewhere classified: Secondary | ICD-10-CM

## 2012-09-01 DIAGNOSIS — M545 Low back pain: Secondary | ICD-10-CM

## 2012-09-01 DIAGNOSIS — M159 Polyosteoarthritis, unspecified: Secondary | ICD-10-CM

## 2012-09-01 DIAGNOSIS — M810 Age-related osteoporosis without current pathological fracture: Secondary | ICD-10-CM

## 2012-09-01 DIAGNOSIS — Z5189 Encounter for other specified aftercare: Secondary | ICD-10-CM

## 2012-09-20 ENCOUNTER — Ambulatory Visit (INDEPENDENT_AMBULATORY_CARE_PROVIDER_SITE_OTHER): Payer: Medicare Other | Admitting: Internal Medicine

## 2012-09-20 ENCOUNTER — Encounter: Payer: Self-pay | Admitting: Internal Medicine

## 2012-09-20 VITALS — BP 148/70 | HR 70 | Temp 98.0°F | Wt 127.0 lb

## 2012-09-20 DIAGNOSIS — R609 Edema, unspecified: Secondary | ICD-10-CM | POA: Insufficient documentation

## 2012-09-20 NOTE — Progress Notes (Signed)
Subjective:    Patient ID: Carla Mendez, female    DOB: May 28, 1923, 79 y.o.   MRN: [id number]  HPI Has had some intermittent ankle swelling Started on the left ~2 weeks ago Now with right foot swelling yesterday No pain Had been on lasix in the past Not really better in the morning No hand swelling  No SOB except mild DOE if extended walking with walker One episode of chest pain-- ?4-5 months ago. Doesn't remember details  No dysuria No hematuria Lower urine amount--trying to increase fluids  Current Outpatient Prescriptions on File Prior to Visit  Medication Sig Dispense Refill  . aspirin EC 81 MG tablet Take 81 mg by mouth daily.      . cholecalciferol (VITAMIN D) 1000 UNITS tablet Take 1,000 Units by mouth daily.      . meclizine (ANTIVERT) 25 MG tablet Take 1 tablet (25 mg total) by mouth 3 (three) times daily as needed.  30 tablet  11  . nystatin (MYCOSTATIN) powder Apply topically 2 (two) times daily as needed.  30 g  1  . triamcinolone cream (KENALOG) 0.1 % Apply 1 application topically 3 (three) times daily.        No current facility-administered medications on file prior to visit.    Allergies  Allergen Reactions  . Ibuprofen     REACTION: swelling  . Meloxicam Swelling    Past Medical History  Diagnosis Date  . Allergy   . Atrial fibrillation   . Hypertension   . Osteoporosis     osteopenia, osteoarthritis  . Renal insufficiency   . Allergic rhinitis   . Psoriasis   . GERD (gastroesophageal reflux disease)     Past Surgical History  Procedure Laterality Date  . Cholecystectomy    . Abdominal hysterectomy    . Appendicitis    . Esophageal dilation    . Basal cell carcinoma excision  2/13    Moh's surgery at [facility]--on face    Family History  Problem Relation Age of Onset  . Heart disease Mother   . Cancer Father     kidney  . Heart disease Father   . Hypertension Brother   . Heart disease Brother   . Breast cancer Neg Hx   . Colon  cancer Neg Hx     History   Social History  . Marital Status: Widowed    Spouse Name: N/A    Number of Children: 4  . Years of Education: N/A   Occupational History  .     Social History Main Topics  . Smoking status: Never Smoker   . Smokeless tobacco: Never Used  . Alcohol Use: No  . Drug Use: No  . Sexual Activity: Not on file   Other Topics Concern  . Not on file   Social History Narrative   Has living will    Son Amada Jupiter, then Onalee Hua, would be health care POA   Would accept trial of resuscitation   No tube feeds if cognitively unaware    Review of Systems Appetite isn't great--but just improving again Sleeping okay for the most part    Objective:   Physical Exam  Constitutional: She appears well-developed and well-nourished. No distress.  Neck: Normal range of motion. Neck supple.  Cardiovascular: Normal rate, regular rhythm, normal heart sounds and intact distal pulses.  Exam reveals no gallop.   No murmur heard. Pulmonary/Chest: Effort normal and breath sounds normal. No respiratory distress. She has no wheezes. She has  no rales.  Musculoskeletal: She exhibits edema.  1+ edema in ankles  Lymphadenopathy:    She has no cervical adenopathy.  Skin:  Red, scaling areas on trunk (has had derm eval without relief) Scab under left knee. No open areas on legs          Assessment & Plan:

## 2012-09-20 NOTE — Assessment & Plan Note (Signed)
Not new Will restart furosemide for prn use Met B in 3 weeks or so Orders given

## 2012-09-21 ENCOUNTER — Telehealth: Payer: Self-pay

## 2012-09-21 NOTE — Telephone Encounter (Signed)
Carla Mendez left v/m; Carla Mendez has been out of town and request report on 09/20/12 visit. Cannot see DPR form signed.Please advise.

## 2012-09-21 NOTE — Telephone Encounter (Signed)
Is this ok?

## 2012-09-22 NOTE — Telephone Encounter (Signed)
Please check with patient to confirm we have her permission Then you can review what happened

## 2012-09-27 ENCOUNTER — Telehealth: Payer: Self-pay | Admitting: *Deleted

## 2012-09-27 NOTE — Telephone Encounter (Signed)
Noted  

## 2012-09-27 NOTE — Telephone Encounter (Signed)
Pt refused physical therapy today states she doesn't feel well, also pt is going to Dr. Shella Spearing for pain control? Per physical therapist

## 2012-09-30 ENCOUNTER — Telehealth: Payer: Self-pay

## 2012-09-30 NOTE — Telephone Encounter (Signed)
Trey Paula PT [facility] left v/m; Trey Paula request verbal order;pt missed PT appt 09/29/12 due to diarrhea and Trey Paula request extension of PT 2 x a week for 3 weeks.Please advise.

## 2012-10-01 NOTE — Telephone Encounter (Signed)
That is fine 

## 2012-10-04 NOTE — Telephone Encounter (Signed)
Spoke with nurse and advised results  

## 2012-10-06 ENCOUNTER — Telehealth: Payer: Self-pay

## 2012-10-06 NOTE — Telephone Encounter (Signed)
Pt left v/m; pt started with diarrhea this morning and has had 5 loose BMs since 8:30 AM. Pt has pain on LLQ and RLQ of abdomen. Pt does not think she has fever. No nausea or vomiting. Pt feels weak due to diarrhea. Pt has taken Kaopectate in the past. Nurse at [facility] advised pt to contact Dr Alphonsus Sias. [place] [facility]. Pt request cb.

## 2012-10-06 NOTE — Telephone Encounter (Signed)
Discussed with her Only just started today Diarrhea stopped ~12:30PM No pain now  No fever No sig blood  Feels better now If recurrent symptoms, can see tomorrow Will send order for kaopectate

## 2012-10-06 NOTE — Telephone Encounter (Signed)
Spoke with patient and she states her son already has a copy of the OV and he gave her a copy also.

## 2012-10-20 ENCOUNTER — Telehealth: Payer: Self-pay

## 2012-10-20 NOTE — Telephone Encounter (Signed)
Trey Paula, PT with [facility] left v/m discharging pt from home health PT due to pt meeting her goals.

## 2012-10-20 NOTE — Telephone Encounter (Signed)
noted 

## 2012-10-21 ENCOUNTER — Other Ambulatory Visit: Payer: Self-pay | Admitting: Internal Medicine

## 2012-10-28 ENCOUNTER — Ambulatory Visit: Payer: Medicare Other | Admitting: Internal Medicine

## 2012-11-02 ENCOUNTER — Other Ambulatory Visit (HOSPITAL_COMMUNITY): Payer: Medicare Other

## 2012-11-03 LAB — SPECIMEN STATUS REPORT

## 2012-11-21 ENCOUNTER — Encounter: Payer: Self-pay | Admitting: Internal Medicine

## 2012-11-21 ENCOUNTER — Ambulatory Visit (INDEPENDENT_AMBULATORY_CARE_PROVIDER_SITE_OTHER): Payer: Medicare Other | Admitting: *Deleted

## 2012-11-21 DIAGNOSIS — I4891 Unspecified atrial fibrillation: Secondary | ICD-10-CM

## 2012-11-21 LAB — PACEMAKER DEVICE OBSERVATION
AL AMPLITUDE: 2.8 mv
BAMS-0001: 150 {beats}/min
RV LEAD AMPLITUDE: 11.2 mv
RV LEAD IMPEDENCE PM: 756 Ohm
RV LEAD THRESHOLD: 1 V
VENTRICULAR PACING PM: 22

## 2012-11-21 NOTE — Progress Notes (Signed)
Pacemaker check in clinic. Normal device function. Thresholds, sensing, impedances consistent with previous measurements. Device programmed to maximize longevity. 68 mode switches, <0.1%.  No high ventricular rates noted. Device programmed at appropriate safety margins. Histogram distribution appropriate for patient activity level. Device programmed to optimize intrinsic conduction. Estimated longevity 9 years. Patient enrolled in remote follow-up/TTM's with [employer]. Plan to follow every 3 months remotely and see annually in office. Patient education completed.  [facility] 02/20/13.

## 2013-01-04 ENCOUNTER — Telehealth: Payer: Self-pay

## 2013-01-04 ENCOUNTER — Ambulatory Visit (INDEPENDENT_AMBULATORY_CARE_PROVIDER_SITE_OTHER)
Admission: RE | Admit: 2013-01-04 | Discharge: 2013-01-04 | Disposition: A | Discharge status: Home / Self Care | Payer: Medicare Other | Source: Ambulatory Visit | Attending: Internal Medicine | Admitting: Internal Medicine

## 2013-01-04 ENCOUNTER — Encounter: Payer: Self-pay | Admitting: Internal Medicine

## 2013-01-04 ENCOUNTER — Ambulatory Visit (INDEPENDENT_AMBULATORY_CARE_PROVIDER_SITE_OTHER): Payer: Medicare Other | Admitting: Internal Medicine

## 2013-01-04 VITALS — BP 118/64 | HR 64 | Temp 97.9°F | Wt 121.5 lb

## 2013-01-04 DIAGNOSIS — M25511 Pain in right shoulder: Secondary | ICD-10-CM

## 2013-01-04 DIAGNOSIS — S51811A Laceration without foreign body of right forearm, initial encounter: Secondary | ICD-10-CM | POA: Insufficient documentation

## 2013-01-04 DIAGNOSIS — S51809A Unspecified open wound of unspecified forearm, initial encounter: Secondary | ICD-10-CM

## 2013-01-04 DIAGNOSIS — M25519 Pain in unspecified shoulder: Secondary | ICD-10-CM

## 2013-01-04 DIAGNOSIS — S42109A Fracture of unspecified part of scapula, unspecified shoulder, initial encounter for closed fracture: Secondary | ICD-10-CM | POA: Insufficient documentation

## 2013-01-04 NOTE — Telephone Encounter (Signed)
Cassandra with [facility] called report for rt shoulder; report given to Dr Alphonsus Sias.

## 2013-01-04 NOTE — Telephone Encounter (Signed)
Discussed the possibility of scapular fracture with the patient Didn't realize she had rib fractures This is 36 hours out She is doing fine with breathing and pain control (tylenol) Sling not a good idea--would worsen balance  Discussed findings with Linda--clinical coordinator at [facility] She will let me know if any worsening pain or problems with the ribs or breathing

## 2013-01-04 NOTE — Progress Notes (Signed)
Pre-visit discussion using our clinic review tool. No additional management support is needed unless otherwise documented below in the visit note.  

## 2013-01-04 NOTE — Telephone Encounter (Signed)
Carla Mendez with [facility] left v/m that Dr Alphonsus Sias had sent rx for silvadene cream but needs directions on how often to use cream. Spoke with Melissa at [facility] and she said response tomorrow would be OK.Please advise.

## 2013-01-04 NOTE — Assessment & Plan Note (Signed)
X-ray looks okay--reviewed by Dr Patsy Lager Does have some tenderness over scapula and questionable spot there--nothing we can do if small fracture anyway Continue tylenol prn

## 2013-01-04 NOTE — Progress Notes (Signed)
Subjective:    Patient ID: Carla Mendez, female    DOB: [birth year], 79 y.o.   MRN: [id number]  HPI Here with aide  Larey Seat 2 nights ago walking from bed to [employer] started sliding--then on the floor [place] onto right side--pain was not that bad at first Didn't have alarm --she had to yell for help Helped up by aides--initial evaluation by nurse didn't seem to reveal signs of fracture  Yesterday, the pain wasn't bad unless she moved it a lot Pain persists today Tylenol has helped the pain  Current Outpatient Prescriptions on File Prior to Visit  Medication Sig Dispense Refill  . acetaminophen (TYLENOL ARTHRITIS PAIN) 650 MG CR tablet Take 650 mg by mouth daily as needed for pain.      Marland Kitchen amiodarone (PACERONE) 200 MG tablet Take 100 mg by mouth daily.      Marland Kitchen aspirin EC 81 MG tablet Take 81 mg by mouth daily.      Marland Kitchen bismuth subsalicylate (PEPTO BISMOL) 262 MG/15ML suspension Take 30 mLs by mouth 4 (four) times daily as needed for indigestion.      . cholecalciferol (VITAMIN D) 1000 UNITS tablet Take 1,000 Units by mouth daily.      . furosemide (LASIX) 20 MG tablet Take 20 mg by mouth daily as needed. For AM leg/foot swelling      . meclizine (ANTIVERT) 25 MG tablet Take 1 tablet (25 mg total) by mouth 3 (three) times daily as needed.  30 tablet  11  . nystatin (MYCOSTATIN) powder Apply topically 2 (two) times daily as needed.  30 g  1  . pantoprazole (PROTONIX) 40 MG tablet Take 40 mg by mouth daily.      Marland Kitchen triamcinolone cream (KENALOG) 0.1 % Apply 1 application topically 3 (three) times daily.        No current facility-administered medications on file prior to visit.    Allergies  Allergen Reactions  . Ibuprofen     REACTION: swelling  . Meloxicam Swelling    Past Medical History  Diagnosis Date  . Allergy   . Atrial fibrillation   . Hypertension   . Osteoporosis     osteopenia, osteoarthritis  . Renal insufficiency   . Allergic rhinitis   . Psoriasis   . GERD  (gastroesophageal reflux disease)     Past Surgical History  Procedure Laterality Date  . Cholecystectomy    . Abdominal hysterectomy    . Appendicitis    . Esophageal dilation    . Basal cell carcinoma excision  2/13    Moh's surgery at [facility]--on face    Family History  Problem Relation Age of Onset  . Heart disease Mother   . Cancer Father     kidney  . Heart disease Father   . Hypertension Brother   . Heart disease Brother   . Breast cancer Neg Hx   . Colon cancer Neg Hx     History   Social History  . Marital Status: Widowed    Spouse Name: N/A    Number of Children: 4  . Years of Education: N/A   Occupational History  .     Social History Main Topics  . Smoking status: Never Smoker   . Smokeless tobacco: Never Used  . Alcohol Use: No  . Drug Use: No  . Sexual Activity: Not on file   Other Topics Concern  . Not on file   Social History Narrative   Has living  will    Son Amada Jupiter, then Onalee Hua, would be health care POA   Would accept trial of resuscitation   No tube feeds if cognitively unaware   Review of Systems No nausea or vomiting Not eating great---weight continues to drop     Objective:   Physical Exam  Constitutional: No distress.  Musculoskeletal:  Mild tenderness over right shoulder Pain with passive ROM  Arm, wrist and hand without tenderness  Skin:  Skin tear on extensor right distal forearm Devitalized skin (black) on lateral side          Assessment & Plan:

## 2013-01-04 NOTE — Assessment & Plan Note (Signed)
Devitalized tissue debrided with sterile pickups and scissors Wound dressed with silvadene and sterile gauze

## 2013-01-05 NOTE — Telephone Encounter (Signed)
Med tech notified.

## 2013-01-05 NOTE — Telephone Encounter (Signed)
I wrote the order Apply to wound daily till scabbed over

## 2013-01-06 ENCOUNTER — Telehealth: Payer: Self-pay | Admitting: Family Medicine

## 2013-01-06 NOTE — Telephone Encounter (Signed)
Instructions given to Numidia, [employer], at [facility].

## 2013-01-06 NOTE — Telephone Encounter (Signed)
Carla Mendez from [facility] left vm; faxed order is incomplete, she needs to know how many times a day to administer medication (did not specify which medication).  Cb [id number].

## 2013-01-06 NOTE — Telephone Encounter (Signed)
The order was DAILY  That means ONCE A DAY  Perhaps you need to speak to [facility]-- the care coordinator

## 2013-01-13 ENCOUNTER — Encounter: Payer: Self-pay | Admitting: Internal Medicine

## 2013-01-30 ENCOUNTER — Encounter: Payer: Self-pay | Admitting: General Surgery

## 2013-01-30 DIAGNOSIS — Z79899 Other long term (current) drug therapy: Secondary | ICD-10-CM | POA: Insufficient documentation

## 2013-01-30 DIAGNOSIS — I495 Sick sinus syndrome: Secondary | ICD-10-CM | POA: Insufficient documentation

## 2013-02-06 NOTE — Telephone Encounter (Signed)
NO OTHER INFO °

## 2013-02-09 ENCOUNTER — Ambulatory Visit: Payer: Medicare Other | Admitting: Cardiology

## 2013-02-16 ENCOUNTER — Ambulatory Visit (INDEPENDENT_AMBULATORY_CARE_PROVIDER_SITE_OTHER): Payer: Medicare Other | Admitting: Cardiology

## 2013-02-16 ENCOUNTER — Encounter: Payer: Self-pay | Admitting: Cardiology

## 2013-02-16 VITALS — BP 150/78 | HR 62 | Ht 61.0 in | Wt 114.8 lb

## 2013-02-16 DIAGNOSIS — I495 Sick sinus syndrome: Secondary | ICD-10-CM

## 2013-02-16 DIAGNOSIS — I4891 Unspecified atrial fibrillation: Secondary | ICD-10-CM

## 2013-02-16 DIAGNOSIS — I10 Essential (primary) hypertension: Secondary | ICD-10-CM

## 2013-02-16 DIAGNOSIS — Z950 Presence of cardiac pacemaker: Secondary | ICD-10-CM

## 2013-02-16 NOTE — Progress Notes (Signed)
[address] [place], [place]  [address] Phone: [phone] Fax:  [phone]  Date:  02/16/2013   ID:  Carla Mendez, DOB 07/19/1923, MRN [id number]  PCP:  Tillman Abideichard Letvak, MD  Cardiologist:  Armanda Magicraci TUrner, MD    History of Present Illness: Carla Mendez is a 80 y.o. female with a history of PAF, tachybrady syndrome s/p PPM and HTN who presents today for followup.  She is doing well.  SHe denies any chest pain, SOB, DOE, dizziness, palpitations or syncope.  She had a fall last Spring and sustained a fall with a compression fracture but is doing well.  Her son says that her dementia is worsening.  She occasionally has some mild LE edema which is chronic and worse when she stands up for a long time.   Wt Readings from Last 3 Encounters:  01/30/13 140 lb (63.504 kg)  01/04/13 121 lb 8 oz (55.112 kg)  09/20/12 127 lb (57.607 kg)     Past Medical History  Diagnosis Date  . Allergy   . Hypertension   . Osteoporosis     osteopenia, osteoarthritis  . Renal insufficiency   . Allergic rhinitis   . Psoriasis   . GERD (gastroesophageal reflux disease)   . Pacemaker     s/p permanent dual-chamber transvenous pacemaker 02-2002  . H/O amiodarone therapy   . Labile blood pressure     systolic  . Venous insufficiency   . Vertigo   . Stress incontinence   . PAF (paroxysmal atrial fibrillation)     not a coumadin candidate due to fall risk  . Tachycardia-bradycardia syndrome 02/2002    s/p PPM    Current Outpatient Prescriptions  Medication Sig Dispense Refill  . acetaminophen (TYLENOL) 325 MG tablet Take 650 mg by mouth 3 (three) times daily as needed.      Marland Kitchen. amiodarone (PACERONE) 200 MG tablet Take 100 mg by mouth daily. 1 tab by mouth Mon-Fri and 1/2 tablet Sat & Sunday      . aspirin EC 81 MG tablet Take 81 mg by mouth daily.      Marland Kitchen. bismuth subsalicylate (PEPTO BISMOL) 262 MG/15ML suspension Take 30 mLs by mouth 4 (four) times daily as needed for indigestion.        . cholecalciferol (VITAMIN D) 1000 UNITS tablet Take 1,000 Units by mouth daily.      . diclofenac sodium (VOLTAREN) 1 % GEL Apply 2 g topically 4 (four) times daily.      . furosemide (LASIX) 20 MG tablet Take 20 mg by mouth daily as needed. For AM leg/foot swelling      . lisinopril (PRINIVIL,ZESTRIL) 20 MG tablet Take 20 mg by mouth daily. 1 tab Monday-Friday and 1/2 tab Sat and Sunday      . meclizine (ANTIVERT) 25 MG tablet Take 1 tablet (25 mg total) by mouth 3 (three) times daily as needed.  30 tablet  11  . nystatin (MYCOSTATIN) powder Apply topically 2 (two) times daily as needed.  30 g  1  . pantoprazole (PROTONIX) 40 MG tablet Take 40 mg by mouth daily.      . sennosides (SENOKOT) 8.8 MG/5ML syrup Take 5 mLs by mouth at bedtime.      . silver sulfADIAZINE (SILVADENE) 1 % cream Apply 1 application topically daily.      . traMADol (ULTRAM) 50 MG tablet Take 50 mg by mouth every 6 (six) hours as needed.      .Marland Kitchen  triamcinolone cream (KENALOG) 0.1 % Apply 1 application topically 3 (three) times daily.        No current facility-administered medications for this visit.    Allergies:    Allergies  Allergen Reactions  . Ibuprofen     REACTION: swelling  . Meloxicam Swelling    Social History:  The patient  reports that she has never smoked. She has never used smokeless tobacco. She reports that she does not drink alcohol or use illicit drugs.   Family History:  The patient's family history includes Cancer in her father; Heart disease in her brother, father, and mother; Hypertension in her brother. There is no history of Breast cancer or Colon cancer.   ROS:  Please see the history of present illness.      All other systems reviewed and negative.   PHYSICAL EXAM: VS:  There were no vitals taken for this visit. Well nourished, well developed, in no acute distress HEENT: normal Neck: no JVD Cardiac:  normal S1, S2; RRR; no murmur Lungs:  clear to auscultation bilaterally, no  wheezing, rhonchi or rales Abd: soft, nontender, no hepatomegaly Ext: no edema Skin: warm and dry Neuro:  CNs 2-12 intact, no focal abnormalities noted  EKG:  Atrial paced rhythm with old septal infarct     ASSESSMENT AND PLAN:  1. PAF maintaining NSR  - continue amiodarone/ASA  - check CMET and TSH  - check PFTs with [facility] 2. Tachybrady syndrome s/p PPM 3. HTN well controlled  - continue Lisinopril  Followup with me in 1 year  Signed, Armanda Magic, MD 02/16/2013 3:25 PM

## 2013-02-16 NOTE — Patient Instructions (Signed)
Your physician recommends that you continue on your current medications as directed. Please refer to the Current Medication list given to you today.  Your physician recommends that you go to the lab today for a TSH and a CMET Panel  Your physician has recommended that you have a pulmonary function test. Pulmonary Function Tests are a group of tests that measure how well air moves in and out of your lungs.  Your physician wants you to follow-up in: 12 Months with Dr Sherlyn Lickurner You will receive a reminder letter in the mail two months in advance. If you don't receive a letter, please call our office to schedule the follow-up appointment.

## 2013-02-17 LAB — COMPREHENSIVE METABOLIC PANEL
ALT: 13 U/L (ref 0–35)
AST: 23 U/L (ref 0–37)
Albumin: 3.9 g/dL (ref 3.5–5.2)
Alkaline Phosphatase: 35 U/L — ABNORMAL LOW (ref 39–117)
BILIRUBIN TOTAL: 0.4 mg/dL (ref 0.3–1.2)
BUN: 27 mg/dL — ABNORMAL HIGH (ref 6–23)
CO2: 26 mEq/L (ref 19–32)
CREATININE: 1.4 mg/dL — AB (ref 0.4–1.2)
Calcium: 9.3 mg/dL (ref 8.4–10.5)
Chloride: 107 mEq/L (ref 96–112)
GFR: 36.57 mL/min — ABNORMAL LOW (ref >60.00)
Glucose, Bld: 92 mg/dL (ref 70–99)
Potassium: 4.6 mEq/L (ref 3.5–5.1)
SODIUM: 142 meq/L (ref 135–145)
Total Protein: 6.5 g/dL (ref 6.0–8.3)

## 2013-02-17 LAB — TSH: TSH: 2.3 u[IU]/mL (ref 0.35–5.50)

## 2013-02-20 ENCOUNTER — Encounter: Payer: Medicare Other | Admitting: *Deleted

## 2013-02-23 ENCOUNTER — Non-Acute Institutional Stay: Payer: Medicare Other | Admitting: Internal Medicine

## 2013-02-23 ENCOUNTER — Encounter: Payer: Self-pay | Admitting: Internal Medicine

## 2013-02-23 VITALS — BP 140/78 | HR 76 | Temp 99.2°F | Wt 125.4 lb

## 2013-02-23 DIAGNOSIS — F0150 Vascular dementia without behavioral disturbance: Secondary | ICD-10-CM | POA: Insufficient documentation

## 2013-02-23 DIAGNOSIS — I10 Essential (primary) hypertension: Secondary | ICD-10-CM

## 2013-02-23 DIAGNOSIS — N183 Chronic kidney disease, stage 3 unspecified: Secondary | ICD-10-CM

## 2013-02-23 DIAGNOSIS — S42109A Fracture of unspecified part of scapula, unspecified shoulder, initial encounter for closed fracture: Secondary | ICD-10-CM

## 2013-02-23 DIAGNOSIS — I4891 Unspecified atrial fibrillation: Secondary | ICD-10-CM

## 2013-02-23 DIAGNOSIS — I480 Paroxysmal atrial fibrillation: Secondary | ICD-10-CM

## 2013-02-23 NOTE — Assessment & Plan Note (Signed)
Regular now Off the asa for some reason--will restart

## 2013-02-23 NOTE — Assessment & Plan Note (Signed)
BP Readings from Last 3 Encounters:  02/23/13 140/78  02/16/13 150/78  01/30/13 114/70   Has been off the lisinopril I am concerned about orthostasis ---I would accept systolic BP up to 160 before restarting

## 2013-02-23 NOTE — Assessment & Plan Note (Signed)
Fairly clear cut now but has insight into the problem

## 2013-02-23 NOTE — Assessment & Plan Note (Signed)
Pain is better Able to use her arm okay now

## 2013-02-23 NOTE — Assessment & Plan Note (Signed)
Stable now Will hold off on furosemide unless significant edema

## 2013-02-23 NOTE — Progress Notes (Signed)
Subjective:    Patient ID: Carla Mendez, female    DOB: 1923-04-08, 80 y.o.   MRN: [id number]  HPI Reviewed status with Linda--clinical coordinator  Shoulder pain is better Able to use arm okay Still walks with the rollator No falls since last visit  Arm injury finally healed up  Has aide to help daily still--morning and evening (she is unsure) Has had slowly increasing confusion  Recent visit to cardiologist No palpitations No chest pain or SOB Does have stable DOE---but done with PT now  Reviewed meds Has not been on lisinopril BP has been okay though I am concerned that falls were related to orthostasis so will stay off it  Current Outpatient Prescriptions on File Prior to Visit  Medication Sig Dispense Refill  . acetaminophen (TYLENOL) 325 MG tablet Take 650 mg by mouth 3 (three) times daily.       Marland Kitchen amiodarone (PACERONE) 200 MG tablet Take 100 mg by mouth daily.       Marland Kitchen aspirin EC 81 MG tablet Take 81 mg by mouth daily.      Marland Kitchen bismuth subsalicylate (PEPTO BISMOL) 262 MG/15ML suspension Take 30 mLs by mouth 4 (four) times daily as needed for indigestion.      . cholecalciferol (VITAMIN D) 1000 UNITS tablet Take 1,000 Units by mouth daily.      . furosemide (LASIX) 20 MG tablet Take 20 mg by mouth daily as needed. For AM leg/foot swelling      . meclizine (ANTIVERT) 25 MG tablet Take 1 tablet (25 mg total) by mouth 3 (three) times daily as needed.  30 tablet  11  . nystatin (MYCOSTATIN) powder Apply topically 2 (two) times daily as needed.  30 g  1  . sennosides (SENOKOT) 8.8 MG/5ML syrup Take 5 mLs by mouth at bedtime.      . traMADol (ULTRAM) 50 MG tablet Take 50 mg by mouth every 6 (six) hours as needed.      . triamcinolone cream (KENALOG) 0.1 % Apply 1 application topically 3 (three) times daily.        No current facility-administered medications on file prior to visit.    Allergies  Allergen Reactions  . Ibuprofen     REACTION: swelling  . Meloxicam  Swelling    Past Medical History  Diagnosis Date  . Allergy   . Hypertension   . Osteoporosis     osteopenia, osteoarthritis  . Renal insufficiency   . Allergic rhinitis   . Psoriasis   . GERD (gastroesophageal reflux disease)   . Pacemaker     s/p permanent dual-chamber transvenous pacemaker 02-2002  . H/O amiodarone therapy   . Labile blood pressure     systolic  . Venous insufficiency   . Vertigo   . Stress incontinence   . PAF (paroxysmal atrial fibrillation)     not a coumadin candidate due to fall risk  . Tachycardia-bradycardia syndrome 02/2002    s/p PPM    Past Surgical History  Procedure Laterality Date  . Cholecystectomy    . Abdominal hysterectomy    . Appendicitis    . Esophageal dilation    . Basal cell carcinoma excision  2/13    Moh's surgery at [facility]--on face    Family History  Problem Relation Age of Onset  . Heart disease Mother   . Cancer Father     kidney  . Heart disease Father   . Hypertension Brother   . Heart disease Brother   .  Breast cancer Neg Hx   . Colon cancer Neg Hx     History   Social History  . Marital Status: Widowed    Spouse Name: N/A    Number of Children: 4  . Years of Education: N/A   Occupational History  .     Social History Main Topics  . Smoking status: Never Smoker   . Smokeless tobacco: Never Used  . Alcohol Use: No  . Drug Use: No  . Sexual Activity: Not on file   Other Topics Concern  . Not on file   Social History Narrative   Has living will    Son Amada JupiterDale, then Onalee HuaDavid, would be health care POA   Would accept trial of resuscitation   No tube feeds if cognitively unaware   Review of Systems Appetite is adequate Last weight probably incorrect--- up 11# and closer to her baseline Generally sleeps okay--but more trouble over the last 4 weeks     Objective:   Physical Exam  Constitutional: She appears well-developed. No distress.  Neck: Normal range of motion. Neck supple.  Cardiovascular:  Normal rate, regular rhythm and normal heart sounds.  Exam reveals no gallop.   No murmur heard. Pulmonary/Chest: Effort normal and breath sounds normal. No respiratory distress. She has no wheezes. She has no rales.  Abdominal: Soft. There is no tenderness.  Musculoskeletal: She exhibits no edema.  Lymphadenopathy:    She has no cervical adenopathy.  Neurological:  Independent sit/stand and using rollator--but unsteady without  Cognitive issues---talking about her husband like he is still alive-then realizing. Forgetful persistently now  Skin:  Left arm totally healed up  Psychiatric: She has a normal mood and affect. Her behavior is normal.          Assessment & Plan:

## 2013-02-28 ENCOUNTER — Encounter: Payer: Self-pay | Admitting: *Deleted

## 2013-03-08 ENCOUNTER — Telehealth: Payer: Self-pay | Admitting: Internal Medicine

## 2013-03-08 NOTE — Telephone Encounter (Signed)
Relevant patient education mailed to patient.  

## 2013-03-09 ENCOUNTER — Telehealth: Payer: Self-pay

## 2013-03-09 NOTE — Telephone Encounter (Signed)
Carla Mendez with Carla Mendez said pt having symptoms of UTI, confusion, odor of urine,burning when urinates,back pain. Carla LandAngela request verbal order for U/A and C&S. Carla Mendez is leaving today and can leave order with any med tech when calling back.

## 2013-03-09 NOTE — Telephone Encounter (Signed)
[facility] Orders given and she requested that they be faxed. Written and faxed as requested to [phone].

## 2013-03-09 NOTE — Telephone Encounter (Signed)
plz give verbal ok to do this, and start cipro 250mg  bid x 5 days while results return.  plz send us results when they arrive  Lab Results  Component Value Date   CREATININE 1.4* 02/16/2013   not on coumadin.  Past Medical History  Diagnosis Date  . Allergy   . Hypertension   . Osteoporosis     osteopenia, osteoarthritis  . Renal insufficiency   . Allergic rhinitis   . Psoriasis   . GERD (gastroesophageal reflux disease)   . Pacemaker     s/p permanent dual-chamber transvenous pacemaker 02-2002  . H/O amiodarone therapy   . Labile blood pressure     systolic  . Venous insufficiency   . Vertigo   . Stress incontinence   . PAF (paroxysmal atrial fibrillation)     not a coumadin candidate due to fall risk  . Tachycardia-bradycardia syndrome 02/2002    s/p PPM  . Vascular dementia of acute onset without behavioral disturbance

## 2013-03-26 NOTE — Telephone Encounter (Signed)
Received notice from lab - testing not done. Verbal order was placed for UA and culture 2/5.  Urine collected on 2/6.  Lab did not receive urine until 2/9 - too old for testing. plz call for update on pt status after 5d cipro course.

## 2013-03-27 NOTE — Telephone Encounter (Signed)
[facility] She said that patient seems to have improved. She is still confused, but that is baseline for her with her hx of dementia. All of the other symptoms have gone away. She did say she wasn't sure why the lab would've sent that notice about the urine sample being too old as it was sent to them the same day it was collected. I advised to contact us if symptoms returned.

## 2013-04-13 ENCOUNTER — Encounter (INDEPENDENT_AMBULATORY_CARE_PROVIDER_SITE_OTHER): Payer: Medicare Other

## 2013-04-13 ENCOUNTER — Encounter (INDEPENDENT_AMBULATORY_CARE_PROVIDER_SITE_OTHER): Payer: Self-pay

## 2013-04-13 DIAGNOSIS — R0602 Shortness of breath: Secondary | ICD-10-CM

## 2013-04-13 DIAGNOSIS — I4891 Unspecified atrial fibrillation: Secondary | ICD-10-CM

## 2013-04-26 ENCOUNTER — Other Ambulatory Visit: Payer: Self-pay | Admitting: Emergency Medicine

## 2013-05-01 DIAGNOSIS — F05 Delirium due to known physiological condition: Secondary | ICD-10-CM

## 2013-05-01 DIAGNOSIS — F0151 Vascular dementia with behavioral disturbance: Secondary | ICD-10-CM

## 2013-05-01 DIAGNOSIS — S329XXA Fracture of unspecified parts of lumbosacral spine and pelvis, initial encounter for closed fracture: Secondary | ICD-10-CM

## 2013-05-01 DIAGNOSIS — R410 Disorientation, unspecified: Secondary | ICD-10-CM

## 2013-05-01 DIAGNOSIS — S42209A Unspecified fracture of upper end of unspecified humerus, initial encounter for closed fracture: Secondary | ICD-10-CM

## 2013-05-01 DIAGNOSIS — I4891 Unspecified atrial fibrillation: Secondary | ICD-10-CM

## 2013-05-02 LAB — PULMONARY FUNCTION TEST
FEF 25-75 POST: 0.61 L/s
FEF 25-75 PRE: 0.89 L/s
FEF2575-%Change-Post: -31 %
FEF2575-%PRED-POST: 77 %
FEF2575-%Pred-Pre: 112 %
FEV1-%Change-Post: -10 %
FEV1-%Pred-Post: 84 %
FEV1-%Pred-Pre: 93 %
FEV1-PRE: 1.22 L
FEV1-Post: 1.1 L
FEV1FVC-%Change-Post: 3 %
FEV1FVC-%PRED-PRE: 98 %
FEV6-%Change-Post: -12 %
FEV6-%PRED-POST: 89 %
FEV6-%Pred-Pre: 102 %
FEV6-POST: 1.49 L
FEV6-Pre: 1.7 L
FEV6FVC-%CHANGE-POST: 0 %
FEV6FVC-%Pred-Post: 107 %
FEV6FVC-%Pred-Pre: 107 %
FVC-%Change-Post: -12 %
FVC-%PRED-PRE: 95 %
FVC-%Pred-Post: 82 %
FVC-POST: 1.49 L
FVC-Pre: 1.71 L
PRE FEV1/FVC RATIO: 71 %
Post FEV1/FVC ratio: 73 %
Post FEV6/FVC ratio: 100 %
Pre FEV6/FVC Ratio: 100 %

## 2013-05-04 NOTE — Progress Notes (Signed)
Now in skilled facility after fall and fractures Hard to assess now due to delirium Will follow as she clears

## 2013-05-25 DIAGNOSIS — S42209A Unspecified fracture of upper end of unspecified humerus, initial encounter for closed fracture: Secondary | ICD-10-CM

## 2013-05-25 DIAGNOSIS — I10 Essential (primary) hypertension: Secondary | ICD-10-CM

## 2013-05-25 DIAGNOSIS — N183 Chronic kidney disease, stage 3 unspecified: Secondary | ICD-10-CM

## 2013-05-25 DIAGNOSIS — S32409A Unspecified fracture of unspecified acetabulum, initial encounter for closed fracture: Secondary | ICD-10-CM

## 2013-06-20 DIAGNOSIS — S42209A Unspecified fracture of upper end of unspecified humerus, initial encounter for closed fracture: Secondary | ICD-10-CM

## 2013-06-20 DIAGNOSIS — S329XXA Fracture of unspecified parts of lumbosacral spine and pelvis, initial encounter for closed fracture: Secondary | ICD-10-CM

## 2013-06-20 DIAGNOSIS — N183 Chronic kidney disease, stage 3 unspecified: Secondary | ICD-10-CM

## 2013-06-20 DIAGNOSIS — R634 Abnormal weight loss: Secondary | ICD-10-CM

## 2013-06-20 DIAGNOSIS — F0150 Vascular dementia without behavioral disturbance: Secondary | ICD-10-CM

## 2013-06-20 DIAGNOSIS — I10 Essential (primary) hypertension: Secondary | ICD-10-CM

## 2013-06-20 DIAGNOSIS — I4891 Unspecified atrial fibrillation: Secondary | ICD-10-CM

## 2013-07-07 DIAGNOSIS — R197 Diarrhea, unspecified: Secondary | ICD-10-CM

## 2013-07-13 ENCOUNTER — Encounter: Payer: Self-pay | Admitting: *Deleted

## 2013-07-20 ENCOUNTER — Encounter: Payer: Self-pay | Admitting: Internal Medicine

## 2013-07-20 ENCOUNTER — Ambulatory Visit (INDEPENDENT_AMBULATORY_CARE_PROVIDER_SITE_OTHER): Payer: Medicare Other | Admitting: *Deleted

## 2013-07-20 DIAGNOSIS — I4891 Unspecified atrial fibrillation: Secondary | ICD-10-CM

## 2013-07-20 DIAGNOSIS — I495 Sick sinus syndrome: Secondary | ICD-10-CM

## 2013-07-20 DIAGNOSIS — I480 Paroxysmal atrial fibrillation: Secondary | ICD-10-CM

## 2013-07-20 NOTE — Progress Notes (Signed)
Remote pacemaker transmission.   

## 2013-07-24 DIAGNOSIS — L02219 Cutaneous abscess of trunk, unspecified: Secondary | ICD-10-CM

## 2013-07-24 DIAGNOSIS — L03319 Cellulitis of trunk, unspecified: Secondary | ICD-10-CM

## 2013-07-26 LAB — MDC_IDC_ENUM_SESS_TYPE_REMOTE
Battery Remaining Longevity: 102 mo
Brady Statistic AP VP Percent: 16 %
Brady Statistic AP VS Percent: 50 %
Brady Statistic AS VP Percent: 0 %
Date Time Interrogation Session: 20150618195422
Lead Channel Pacing Threshold Amplitude: 0.5 V
Lead Channel Pacing Threshold Amplitude: 1 V
Lead Channel Pacing Threshold Pulse Width: 0.4 ms
Lead Channel Pacing Threshold Pulse Width: 0.4 ms
Lead Channel Sensing Intrinsic Amplitude: 1.4 mV
Lead Channel Sensing Intrinsic Amplitude: 8 mV
Lead Channel Setting Pacing Pulse Width: 0.4 ms
MDC IDC MSMT BATTERY IMPEDANCE: 281 Ohm
MDC IDC MSMT BATTERY VOLTAGE: 2.76 V
MDC IDC MSMT LEADCHNL RA IMPEDANCE VALUE: 424 Ohm
MDC IDC MSMT LEADCHNL RV IMPEDANCE VALUE: 730 Ohm
MDC IDC SET LEADCHNL RA PACING AMPLITUDE: 2 V
MDC IDC SET LEADCHNL RV PACING AMPLITUDE: 2.5 V
MDC IDC SET LEADCHNL RV SENSING SENSITIVITY: 4 mV
MDC IDC STAT BRADY AS VS PERCENT: 34 %

## 2013-07-27 ENCOUNTER — Encounter: Payer: Self-pay | Admitting: Cardiology

## 2013-07-29 ENCOUNTER — Other Ambulatory Visit: Payer: Self-pay | Admitting: Emergency Medicine

## 2013-08-02 IMAGING — CT CT LUMBAR SPINE WITHOUT CONTRAST
1 of 6 series · 2 of 14 positions shown, 3 images · non-contrast
Comparison: none

REASON FOR EXAM: lumbar pain    pt pacemaker
COMMENTS:

[Series 7: soft tissue · axial · 0.28mm/px · z∈[-244,-182]mm · 2 of 93 slices shown, 3 images]
[im 31/93  soft-tissue]
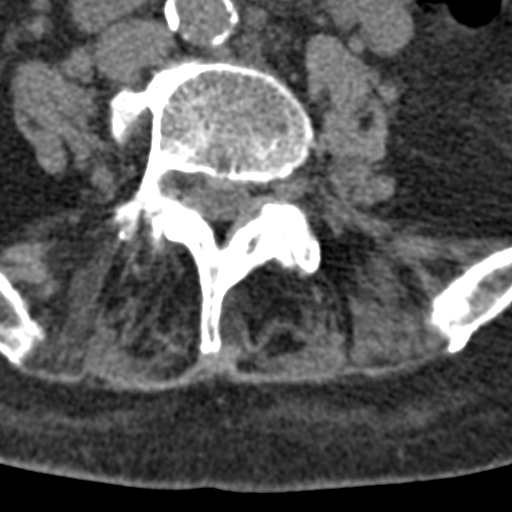
[im 31/93  bone]
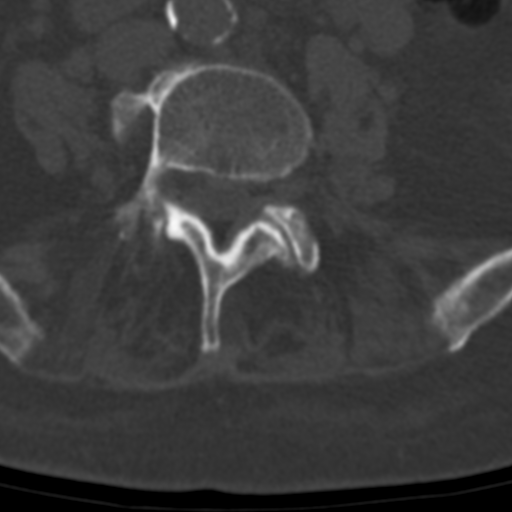
[im 62/93  bone]
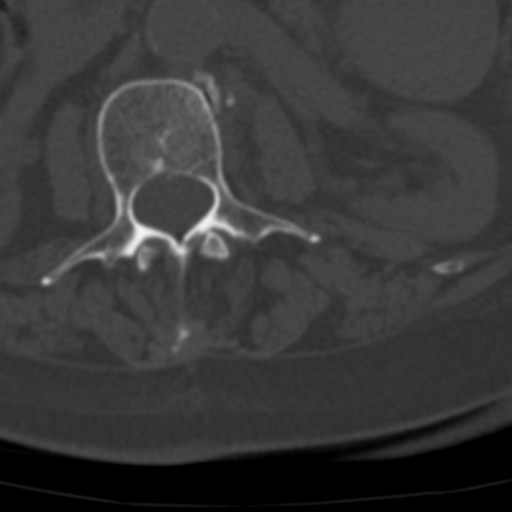

[2 of 14 positions shown; findings below may reference images not displayed]

PROCEDURE:     CT  - CT LUMBAR SPINE WO  - September 09, 2010  [DATE]

RESULT:     Sagittal, axial, and coronal images through the lumbar spine are
reviewed. Reference is made to the previous CT scan of the lumbar spine 31 July, 2009.

There are vacuum disc phenomena at L to 3, L3-L4 and L4-L5. The eburnation
of the articular surface along the inferior endplate of L2 and along the
inferior endplate of L3 and superior endplate of L4 is present. Both have
progressed since the previous study especially at L2. Posterior endplate
spurs are noted at L2-L3 and L3-L4.

At the T12-L1 level the AP dimension of the bony spinal canal is at least 16
mm and there is wide patency the neural foramina.

At L1-L2 there is minimal annular disc bulging. The midline the AP dimension
of the spinal canals is 13 mm neural foramina are patent.

At L2-L3 there is exuberant anterior left lateral osteophyte. There is
annular disc bulging. In the midline the AP dimension of the spinal canal is
approximately 12 mm. Is mild encroachment upon the left neural foramen. The
right the neural foramen is patent.

At L3-L4 there is a prominent endplate bar. In the midline the AP dimension
of the spinal canal is approximately 9 mm. There is encroachment upon the
neural foramina bilaterally. There is abundant facet joint hypertrophy here

At L4-L5 there is annular disc bulging and as well as and endplate bar. In
the midline the AP dimension of the spinal canal is approximately 8 mm.
There is facet joint hypertrophy here. There is mild encroachment upon the
left neural foramen.

At L5-S1 is annular disc bulging. In the midline the AP dimension the thecal
sac measures 11 mm. There is mild to moderate encroachment upon the neural
foramina bilaterally.
IMPRESSION: There are moderate to severe the degenerative disc changes at the L2-L3 and
L3-L4 disc levels with milder change at L4-L5. The findings at L2-L3 are
more prominent than on the previous study. There are varying degrees of mild
to moderate neural foraminal encroachment present. Prominent posterior
endplate osteophytes at L2-L3 and at L3-L4 produce mild ventral impressions
upon the the thecal sac. These are not greatly changed at L3-L4 but have
become more prominent at L2-L3.

## 2013-08-02 DEATH — deceased

## 2013-08-08 ENCOUNTER — Telehealth: Payer: Self-pay | Admitting: Cardiology

## 2013-08-08 NOTE — Telephone Encounter (Signed)
New message   FYI .  Past away on June  28.

## 2013-08-08 NOTE — Telephone Encounter (Signed)
Called medical records to make aware to update chart./[facility].   To Dr Mayford Knifeurner as a [place] ChildesFYI

## 2013-08-18 ENCOUNTER — Telehealth: Payer: Self-pay | Admitting: Cardiology

## 2013-08-18 NOTE — Telephone Encounter (Signed)
Return kit offered, but informed him that he may dispose of it himself or take it to a local store for recycling. Son states that  if he does not have to return it then he would dispose of it on his own.

## 2013-08-18 NOTE — Telephone Encounter (Signed)
Pt passed away,he wants to know what to do about her pacemaker,the one she did over the phone.

## 2015-01-11 IMAGING — CR DG RIBS 2V*R*
1 series · 3 of 3 positions shown · non-contrast
Comparison: none

REASON FOR EXAM: ribs right
COMMENTS:

[Series 1: t ribs ap upper right · 0.14mm/px · 3 of 3 slices shown]
[im 1/3]
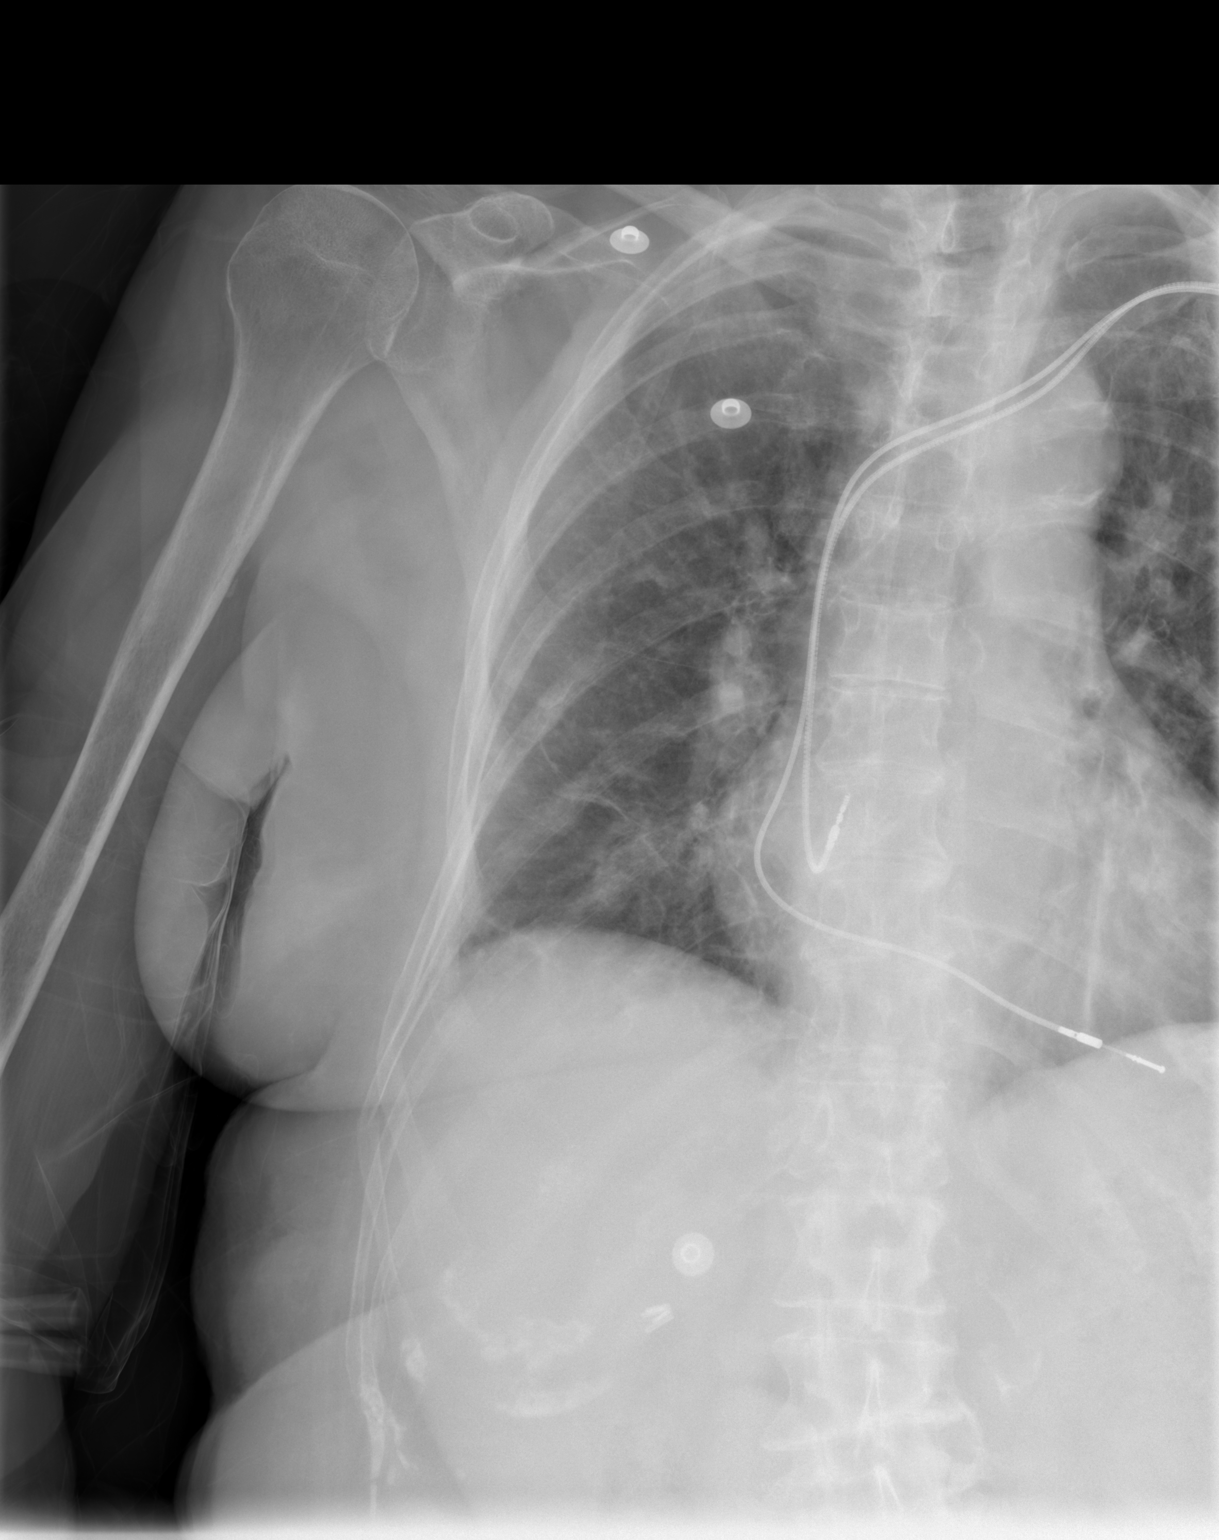
[im 2/3]
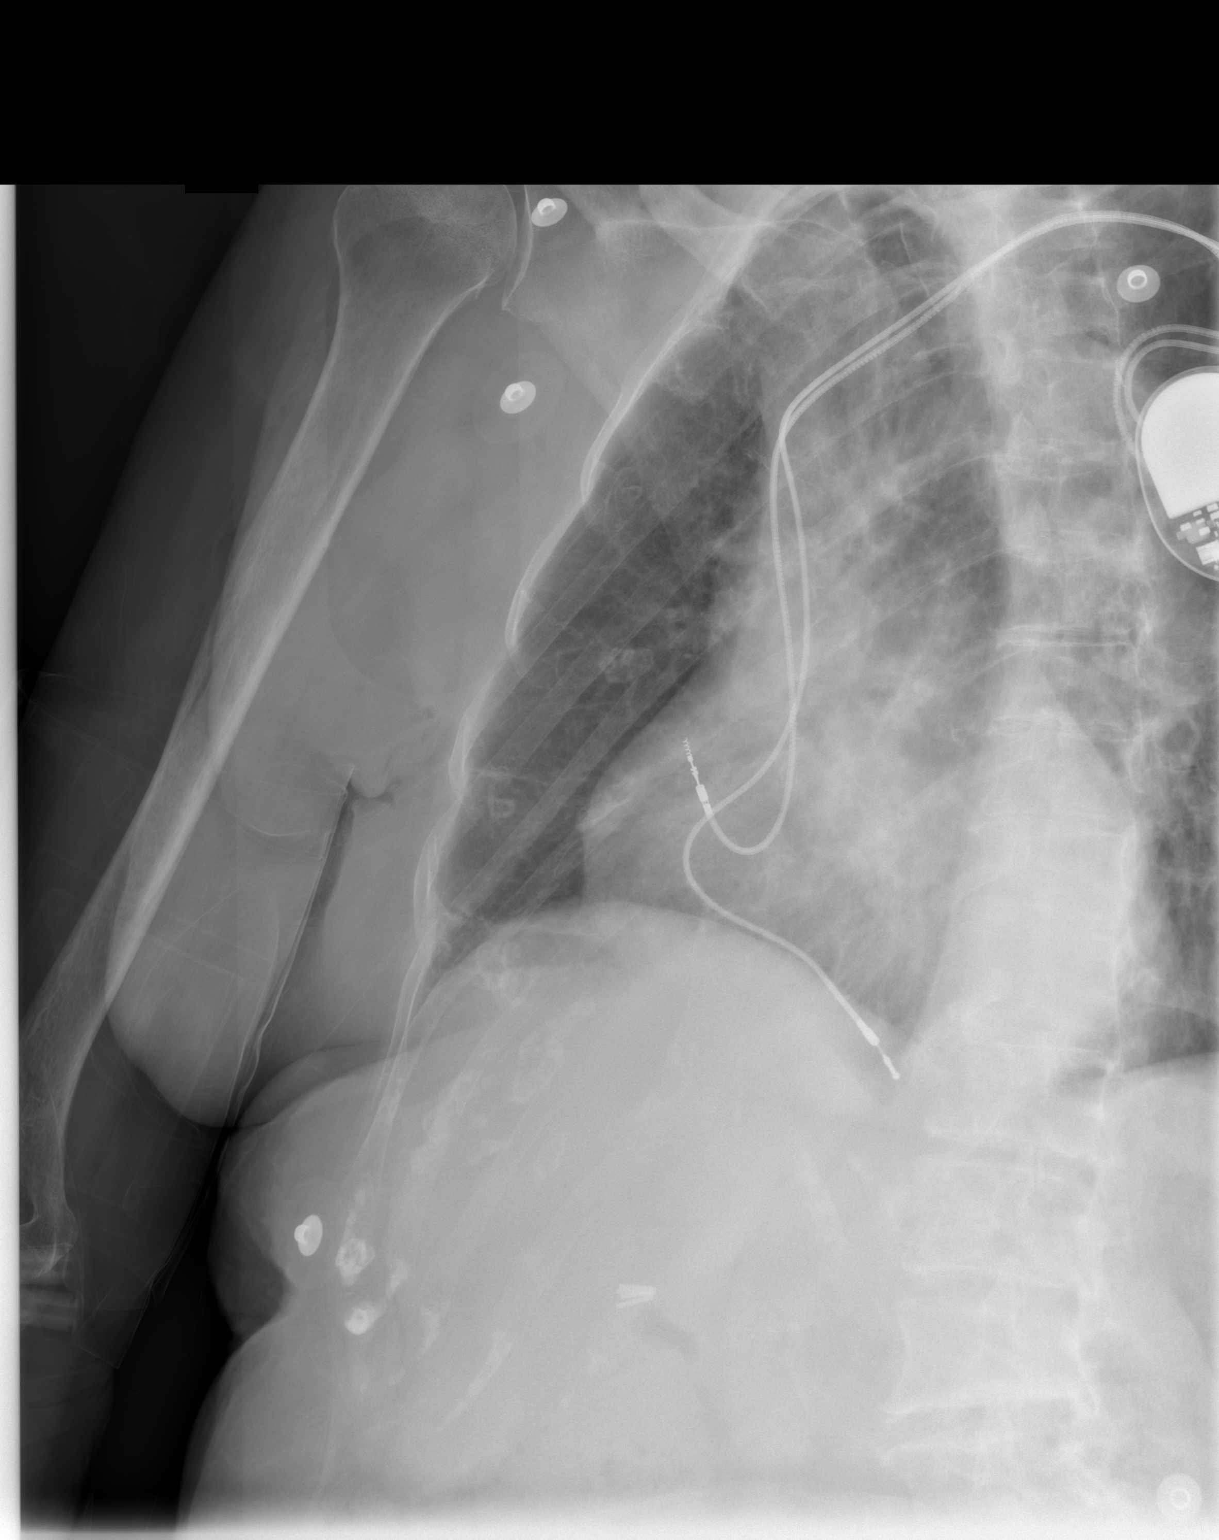
[im 3/3]
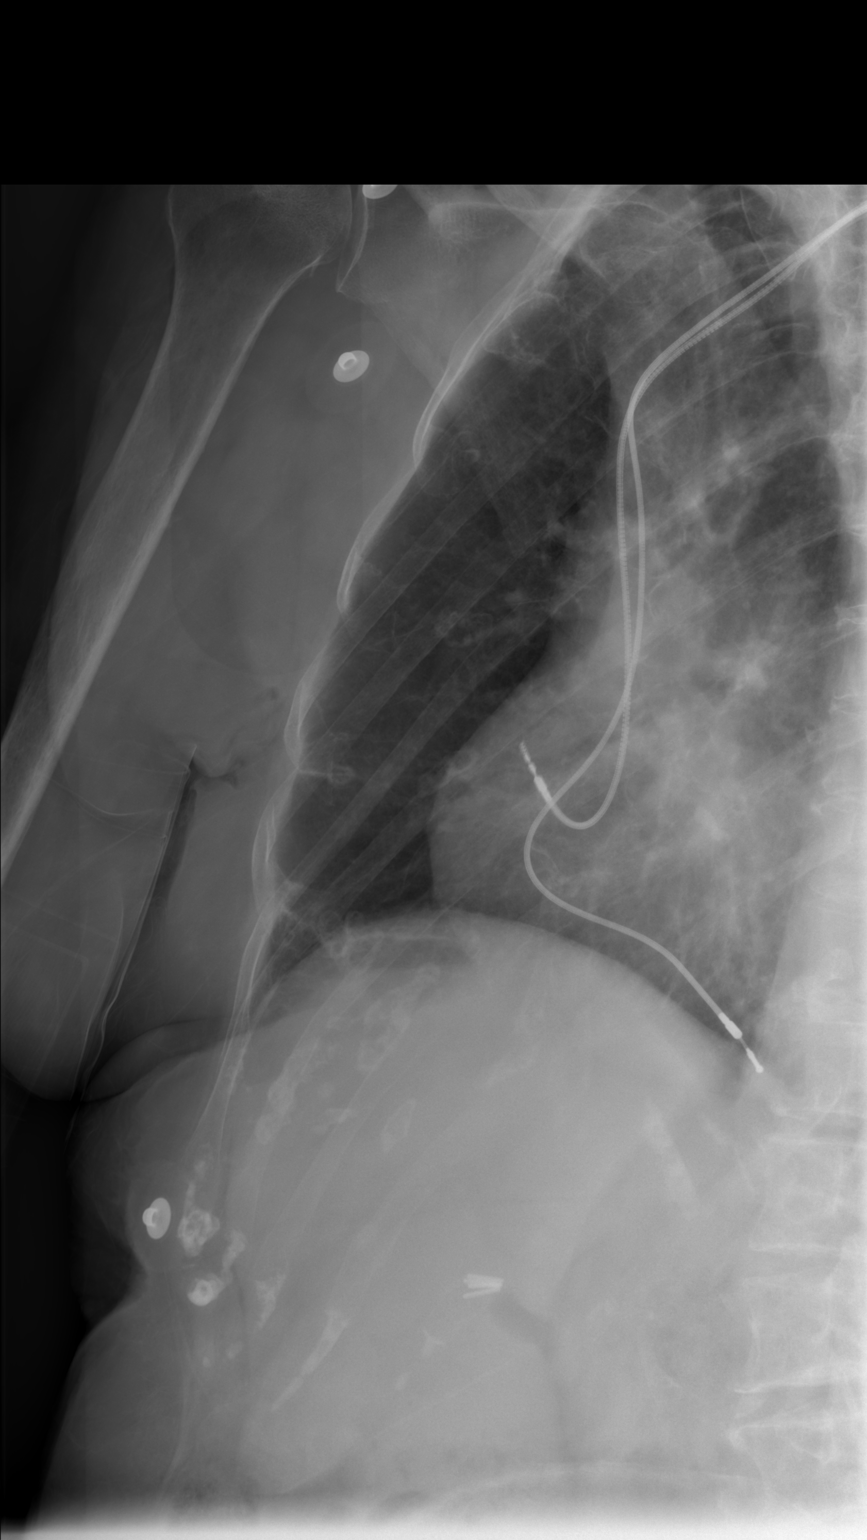

[3 of 3 positions shown; findings below may reference images not displayed]

PROCEDURE:     DXR - DXR RIBS RIGHT UNILATERAL  - February 18, 2012  [DATE]

RESULT:     A right rib detail study was performed. The bones are
osteopenic. There is deformity of the lateral aspect of the right eighth
rib. I do not see definite fractures elsewhere. There is no pleural effusion
or pneumothorax.
IMPRESSION: There is a mildly angulated fracture of the lateral aspect
of the right eighth rib.

[REDACTED]

## 2015-11-28 IMAGING — CR DG SHOULDER 2+V*R*
3 series · 3 of 3 positions shown · non-contrast
Comparison: Chest x-ray 10/15/2000.

CLINICAL DATA: Fall.  Pain.  .

EXAM:
RIGHT SHOULDER - 2+ VIEW

[view not recorded (1 of 3)]
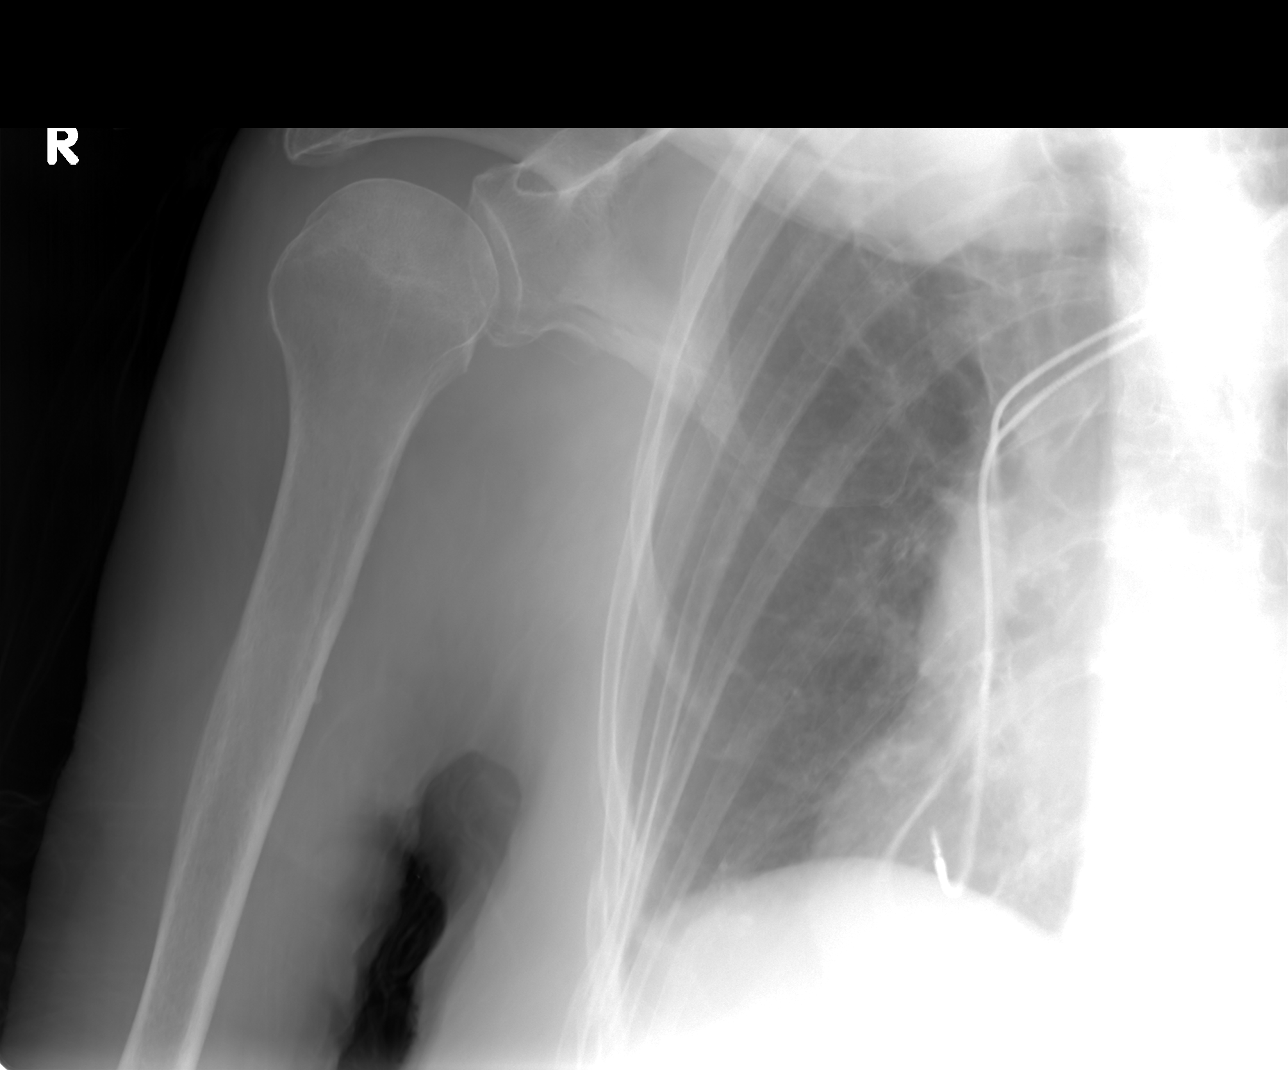

[view not recorded (2 of 3)]
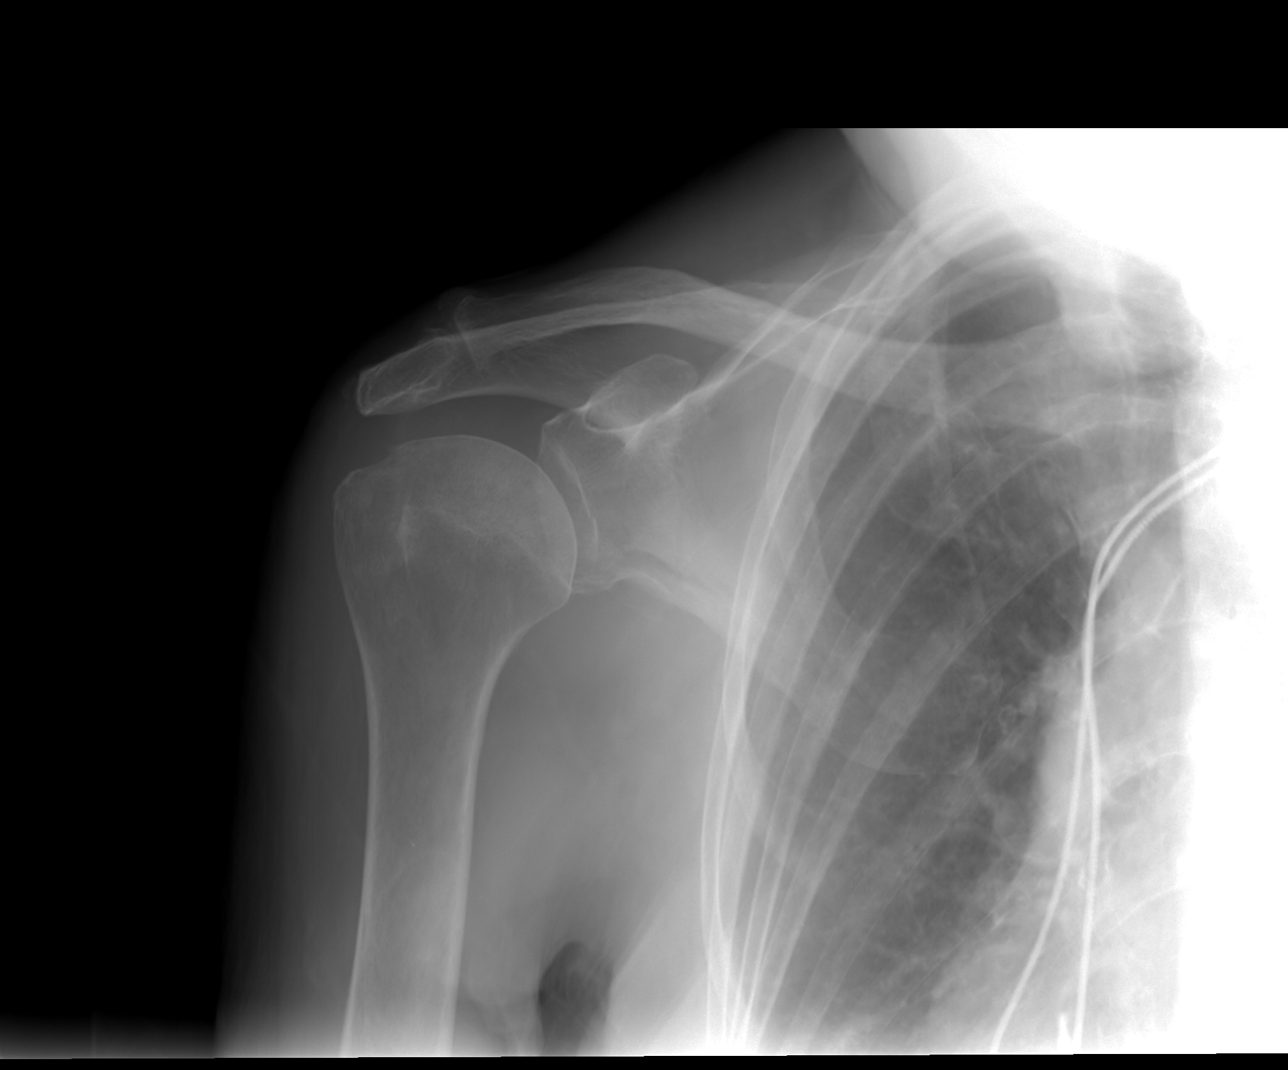

[view not recorded (3 of 3)]
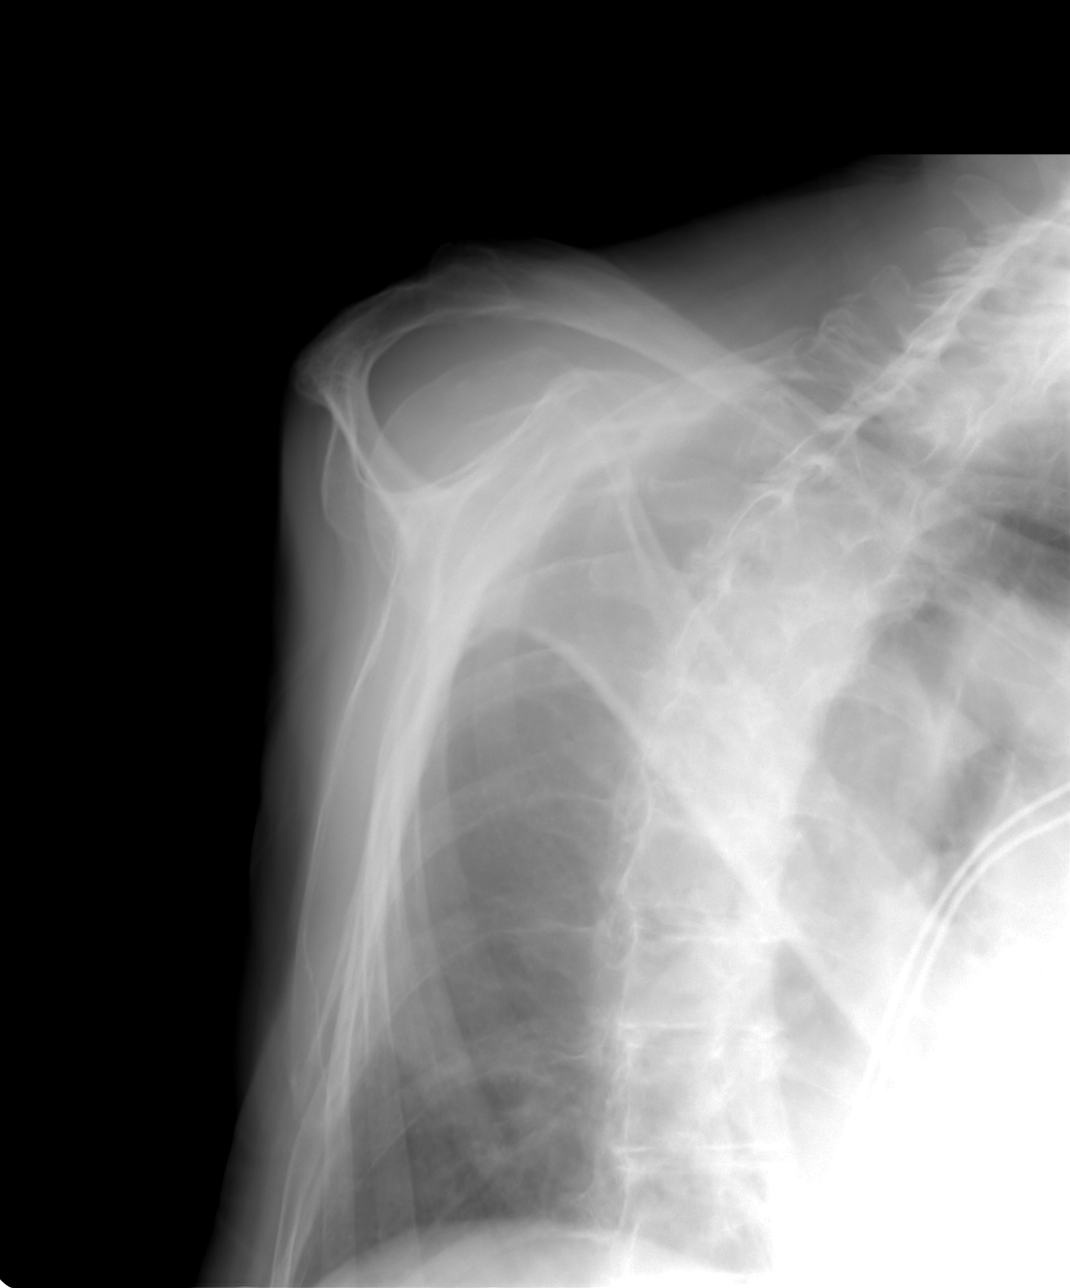

[3 of 3 positions shown; findings below may reference images not displayed]

FINDINGS: The appears to be a slightly displaced fracture of the lateral
border of the scapula possibly extending into the glenoid. CT can be
obtained for further evaluation. No evidence of shoulder dislocation
or separation. Cardiac pacer is present.

Slightly displaced lower posterior lateral right rib fractures
appear to be present. A right rib series and chest x-ray can be
obtained for further evaluation. No definite evidence of
pneumothorax.
IMPRESSION: 1. Findings consistent with fracture of the scapula of possible
extension into the glenoid. CT can be obtained for further
characterization.
2. Right lower rib fractures. Chest x-ray and right rib series can
be obtained for further evaluation. These results will be called to
the ordering clinician or representative by the Radiologist
Assistant, and communication documented in the PACS Dashboard.
